# Patient Record
Sex: Male | Born: 2000 | Race: Black or African American | Hispanic: No | Marital: Single
Health system: Southern US, Academic
[De-identification: ages and names within clinical notes are randomized; demographics above are authoritative.]

## PROBLEM LIST (undated history)

## (undated) ENCOUNTER — Encounter: Attending: Family | Primary: Family

## (undated) ENCOUNTER — Ambulatory Visit

## (undated) ENCOUNTER — Ambulatory Visit: Payer: BLUE CROSS/BLUE SHIELD

## (undated) ENCOUNTER — Encounter

## (undated) ENCOUNTER — Ambulatory Visit: Payer: Medicaid (Managed Care) | Attending: Family | Primary: Family

## (undated) ENCOUNTER — Telehealth: Attending: Family | Primary: Family

## (undated) ENCOUNTER — Ambulatory Visit: Payer: Medicaid (Managed Care)

## (undated) ENCOUNTER — Ambulatory Visit: Payer: BLUE CROSS/BLUE SHIELD | Attending: Family | Primary: Family

## (undated) ENCOUNTER — Telehealth

## (undated) ENCOUNTER — Ambulatory Visit: Attending: Family | Primary: Family

## (undated) ENCOUNTER — Encounter
Attending: Pharmacist Clinician (PhC)/ Clinical Pharmacy Specialist | Primary: Pharmacist Clinician (PhC)/ Clinical Pharmacy Specialist

## (undated) HISTORY — PX: ADENOIDECTOMY: SHX5191

## (undated) HISTORY — PX: TYMPANOSTOMY TUBE PLACEMENT: SHX32

## (undated) HISTORY — PX: CIRCUMCISION: SUR203

---
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
## 2019-01-02 ENCOUNTER — Telehealth (INDEPENDENT_AMBULATORY_CARE_PROVIDER_SITE_OTHER): Payer: Self-pay | Admitting: Family

## 2019-01-02 DIAGNOSIS — G43009 Migraine without aura, not intractable, without status migrainosus: Secondary | ICD-10-CM

## 2019-01-02 MED ORDER — TOPIRAMATE 100 MG PO TABS: ORAL_TABLET | ORAL | 5 refills | Status: DC

## 2019-01-03 NOTE — Telephone Encounter (Signed)
Refill was sent to pharmacy.

## 2019-01-24 ENCOUNTER — Other Ambulatory Visit: Payer: Self-pay | Admitting: Family

## 2019-01-24 DIAGNOSIS — G43009 Migraine without aura, not intractable, without status migrainosus: Secondary | ICD-10-CM

## 2019-04-04 ENCOUNTER — Ambulatory Visit (INDEPENDENT_AMBULATORY_CARE_PROVIDER_SITE_OTHER): Payer: Medicaid Other | Admitting: Family

## 2019-04-08 ENCOUNTER — Ambulatory Visit (INDEPENDENT_AMBULATORY_CARE_PROVIDER_SITE_OTHER): Payer: Medicaid Other | Admitting: Family

## 2019-04-08 ENCOUNTER — Encounter (INDEPENDENT_AMBULATORY_CARE_PROVIDER_SITE_OTHER): Payer: Self-pay | Admitting: Family

## 2019-04-08 ENCOUNTER — Other Ambulatory Visit: Payer: Self-pay

## 2019-04-08 VITALS — BP 106/74 | HR 76 | Ht 69.5 in | Wt 178.0 lb

## 2019-04-08 DIAGNOSIS — G43009 Migraine without aura, not intractable, without status migrainosus: Secondary | ICD-10-CM | POA: Diagnosis not present

## 2019-04-08 DIAGNOSIS — G44219 Episodic tension-type headache, not intractable: Secondary | ICD-10-CM | POA: Diagnosis not present

## 2019-04-08 MED ORDER — TOPIRAMATE 100 MG PO TABS: ORAL_TABLET | ORAL | 5 refills | Status: DC

## 2019-04-08 NOTE — Patient Instructions (Signed)
Thank you for coming in today.   Instructions for you until your next appointment are as follows: 1. Continue taking the Topiramate 100mg  at bedtime each day 2. Remember that it is important for you to avoid skipping meals, to drink at leats 48-60 oz of water each day and to get at least 8-9 hours of sleep each night, as these things are known to affect headache frequency and severity 3. Please sign up for MyChart if you have not done so 4. Please plan to return for follow up in one year or sooner if needed.

## 2019-04-08 NOTE — Progress Notes (Signed)
[facility]   MRN:  [id number]  [facility]   Provider: Elveria Risingina Jacqueli Pangallo NP-C [facility]  Visit type: Routine Follow-Up  Last visit: 05/14/2018  Referral source: Steven Moralesavid Theis, MD History from: [facility] chart, mother and patient  Brief history:  History of migraine without aura and episodic tension headaches. He is taking and tolerating Topiramate which has worked well to reduce his headache frequency and severity.    Today's concerns: [place] reports today that he has only experienced a few headaches since his last visit in July. He denies any side effect to Topiramate and says that he drinks a large amount of water each day. He just finished his first year at [employer] [place] in [place] and said that things went well for him. His mother feels that not having a roommate worked well for [place] when he had a headache so that he could get enough rest to recover and not miss classes. He is hopeful that the campus will be open in the fall instead of doing online classwork due to [place] pandemic. He says that he enjoys cooking and eating when he is not in school.   [place] has been otherwise generally healthy since he was last seen. Neither he nor his mother have other health concerns for him today other than previously mentioned.   Review of systems: Please see HPI for neurologic and other pertinent review of systems. Otherwise all other systems were reviewed and were negative.  Problem List: Patient Active Problem List   Diagnosis Date Noted  . Migraine without aura and without status migrainosus, not intractable 01/02/2014  . Episodic tension type headache 01/02/2014     History reviewed. No pertinent past medical history.  Past medical history comments: See HPI Copied from previous record: The patient had ear infections on-off beginning at 3 months until he received myringotomy tubes. He has had streptococcal pharyngitis on 2  occasions beginning at age 89. The patient had onset of allergies at age 89. He was tried on Propranolol for migraine prevention before taking Topiramate but it made him sleepy during the day.   Surgical history: Past Surgical History:  Procedure Laterality Date  . ADENOIDECTOMY    . CIRCUMCISION    . TYMPANOSTOMY TUBE PLACEMENT       Family history: family history includes Migraines in his father.   Social history: Social History   Socioeconomic History  . Marital status: Single    Spouse name: Not on file  . Number of children: Not on file  . Years of education: Not on file  . Highest education level: Not on file  Occupational History  . Not on file  Social Needs  . Financial resource strain: Not on file  . Food insecurity:    Worry: Not on file    Inability: Not on file  . Transportation needs:    Medical: Not on file    Non-medical: Not on file  Tobacco Use  . Smoking status: Never Smoker  . Smokeless tobacco: Never Used  Substance and Sexual Activity  . Alcohol use: No  . Drug use: No  . Sexual activity: Never  Lifestyle  . Physical activity:    Days per week: Not on file    Minutes per session: Not on file  . Stress: Not on file  Relationships  . Social connections:    Talks on phone: Not on file    Gets together: Not on file  Attends religious service: Not on file    Active member of club or organization: Not on file    Attends meetings of clubs or organizations: Not on file    Relationship status: Not on file  . Intimate partner violence:    Fear of current or ex partner: Not on file    Emotionally abused: Not on file    Physically abused: Not on file    Forced sexual activity: Not on file  Other Topics Concern  . Not on file  Social History Narrative   "[place]" will be attending [employer] [place] in the Fall.  He is doing well.   Lives with his mother. He enjoys music, hanging out with his friends, and watching TV.       Past/failed  meds: Propranolol for migraine prevention - had sleepiness as a side effect  Allergies: No Known Allergies    Immunizations:  There is no immunization history on file for this patient.   Physical Exam: BP 106/74   Pulse 76   Ht 5' 9.5" (1.765 m)   Wt 178 lb (80.7 kg)   BMI 25.91 kg/m   General: well developed, well nourished young man, seated on exam table, in no evident distress; black hair, brown eyes, right handed Head: normocephalic and atraumatic. Oropharynx benign. No dysmorphic features. Wears glasses. Neck: supple with no carotid bruits. No focal tenderness. Cardiovascular: regular rate and rhythm, no murmurs. Respiratory: Clear to auscultation bilaterally Abdomen: Bowel sounds present all four quadrants, abdomen soft, non-tender, non-distended.  Musculoskeletal: No skeletal deformities or obvious scoliosis Skin: no rashes or neurocutaneous lesions  Neurologic Exam Mental Status: Awake and fully alert.  Attention span, concentration, and fund of knowledge appropriate for age.  Speech fluent without dysarthria.  Able to follow commands and participate in examination. Cranial Nerves: Fundoscopic exam - red reflex present.  Unable to fully visualize fundus.  Pupils equal briskly reactive to light.  Extraocular movements full without nystagmus.  Visual fields full to confrontation.  Hearing intact and symmetric to finger rub.  Facial sensation intact.  Face, tongue, palate move normally and symmetrically.  Neck flexion and extension normal. Motor: Normal bulk and tone.  Normal strength in all tested extremity muscles. Sensory: Intact to touch and temperature in all extremities. Coordination: Rapid movements: finger and toe tapping normal and symmetric bilaterally.  Finger-to-nose and heel-to-shin intact bilaterally.  Able to balance on either foot. Romberg negative. Gait and Station: Arises from chair, without difficulty. Stance is normal.  Gait demonstrates normal stride length  and balance. Able to hop. Able to heel, toe and tandem walk without difficulty. Reflexes: Diminished and symmetric. Toes downgoing. No clonus.   Impression: 1. Migraine without aura 2. Episodic tension headache   Recommendations for plan of care: The patient's previous [facility] records were reviewed. Mansour has neither had nor required imaging or lab studies since the last visit. He is an [place] year old young man with history of migraine and tension headaches. He is taking and tolerating Topiramate for migraine prevention, which has worked well to reduce headache frequency and severity. I will make no changes in his treatment plan at this time. I reminded Elmon of the need for him to be well hydrated each day, to avoid skipping meals and to get at least 8-9 hours of sleep each night. I will see him back in follow up in 1 year or sooner if needed. He and his mother agreed with the plans made today.  The medication list was reviewed and reconciled. No changes were made in the prescribed medications today. A complete medication list was provided to the patient.  Allergies as of 04/08/2019   No Known Allergies     Medication List       Accurate as of Apr 08, 2019 12:16 PM. If you have any questions, ask your nurse or doctor.        cetirizine 10 MG tablet Commonly known as:  ZYRTEC TK 1 T PO QHS   fluticasone 50 MCG/ACT nasal spray Commonly known as:  FLONASE INSTILL 1 SPRAY INTO EACH NOSTRIL ONCE D   ibuprofen 400 MG tablet Commonly known as:  ADVIL Take 1 tablet (400 mg total) by mouth every 6 (six) hours as needed. Takes as needed   ondansetron 4 MG disintegrating tablet Commonly known as:  Zofran ODT Place 1 tablet under tongue at onset of nausea & vomiting. May repeat in 8 hours if needed.   topiramate 100 MG tablet Commonly known as:  TOPAMAX TAKE 1 TABLET BY MOUTH AT BEDTIME   topiramate 100 MG tablet Commonly known as:  TOPAMAX TAKE 1 TABLET(100 MG) BY MOUTH AT  BEDTIME        Total time spent with the patient was 20 minutes, of which 50% or more was spent in counseling and coordination of care.  Steven Rising NP-C [facility] Ph. [phone] Fax [phone]

## 2019-05-28 ENCOUNTER — Other Ambulatory Visit (INDEPENDENT_AMBULATORY_CARE_PROVIDER_SITE_OTHER): Payer: Self-pay | Admitting: Family

## 2019-05-28 DIAGNOSIS — G43009 Migraine without aura, not intractable, without status migrainosus: Secondary | ICD-10-CM

## 2019-05-28 MED ORDER — IBUPROFEN 400 MG PO TABS: 400.0000 mg | ORAL_TABLET | Freq: Four times a day (QID) | ORAL | 5 refills | Status: AC | PRN

## 2020-01-29 ENCOUNTER — Ambulatory Visit: Payer: Medicaid Other | Attending: Internal Medicine

## 2020-01-29 DIAGNOSIS — Z23 Encounter for immunization: Secondary | ICD-10-CM

## 2020-01-29 NOTE — Progress Notes (Signed)
   Covid-19 Vaccination Clinic  Name:  [facility]    MRN: [id number] DOB: October 04, 2001  01/29/2020  Mr. Christen was observed post Covid-19 immunization for 15 minutes without incident. He was provided with Vaccine Information Sheet and instruction to access the V-Safe system.   Mr. Parkey was instructed to call 911 with any severe reactions post vaccine: Marland Kitchen Difficulty breathing  . Swelling of face and throat  . A fast heartbeat  . A bad rash all over body  . Dizziness and weakness   Immunizations Administered    Name Date Dose VIS Date Route   Pfizer COVID-19 Vaccine 01/29/2020 11:58 AM 0.3 mL 10/24/2019 Intramuscular   Manufacturer: [employer], [employer]   Lot: [id number]   NDC: [address]-3

## 2020-02-03 ENCOUNTER — Other Ambulatory Visit: Payer: Self-pay | Admitting: Pediatrics

## 2020-02-03 DIAGNOSIS — G43009 Migraine without aura, not intractable, without status migrainosus: Secondary | ICD-10-CM

## 2020-02-23 ENCOUNTER — Ambulatory Visit: Payer: Medicaid Other | Attending: Internal Medicine

## 2020-02-23 DIAGNOSIS — Z23 Encounter for immunization: Secondary | ICD-10-CM

## 2020-02-23 NOTE — Progress Notes (Signed)
   Covid-19 Vaccination Clinic  Name:  Steven [place]    MRN: [id number] DOB: 2001-07-19  02/23/2020  [place] was observed post Covid-19 immunization for 15 minutes without incident. He was provided with Vaccine Information Sheet and instruction to access the V-Safe system.   [place] was instructed to call 911 with any severe reactions post vaccine: Marland Kitchen Difficulty breathing  . Swelling of face and throat  . A fast heartbeat  . A bad rash all over body  . Dizziness and weakness   Immunizations Administered    Name Date Dose VIS Date Route   Pfizer COVID-19 Vaccine 02/23/2020 11:01 AM 0.3 mL 10/24/2019 Intramuscular   Manufacturer: [employer], [employer]   Lot: [id number]   NDC: [address]-2

## 2021-01-31 ENCOUNTER — Ambulatory Visit (INDEPENDENT_AMBULATORY_CARE_PROVIDER_SITE_OTHER): Payer: Medicaid Other | Admitting: Family

## 2021-02-21 ENCOUNTER — Other Ambulatory Visit: Payer: Self-pay

## 2021-02-21 ENCOUNTER — Encounter (HOSPITAL_COMMUNITY): Payer: Self-pay

## 2021-02-21 ENCOUNTER — Emergency Department (HOSPITAL_COMMUNITY)
Admission: EM | Admit: 2021-02-21 | Discharge: 2021-02-22 | Disposition: A | Discharge status: Home / Self Care | Payer: Medicaid Other | Attending: Emergency Medicine | Admitting: Emergency Medicine

## 2021-02-21 ENCOUNTER — Emergency Department (HOSPITAL_COMMUNITY): Payer: Medicaid Other

## 2021-02-21 DIAGNOSIS — Z20822 Contact with and (suspected) exposure to covid-19: Secondary | ICD-10-CM | POA: Diagnosis not present

## 2021-02-21 DIAGNOSIS — R748 Abnormal levels of other serum enzymes: Secondary | ICD-10-CM

## 2021-02-21 DIAGNOSIS — R1110 Vomiting, unspecified: Secondary | ICD-10-CM | POA: Diagnosis not present

## 2021-02-21 DIAGNOSIS — R300 Dysuria: Secondary | ICD-10-CM | POA: Insufficient documentation

## 2021-02-21 DIAGNOSIS — R197 Diarrhea, unspecified: Secondary | ICD-10-CM | POA: Diagnosis not present

## 2021-02-21 DIAGNOSIS — R5383 Other fatigue: Secondary | ICD-10-CM | POA: Diagnosis not present

## 2021-02-21 DIAGNOSIS — R509 Fever, unspecified: Secondary | ICD-10-CM | POA: Diagnosis not present

## 2021-02-21 DIAGNOSIS — R519 Headache, unspecified: Secondary | ICD-10-CM | POA: Insufficient documentation

## 2021-02-21 DIAGNOSIS — K760 Fatty (change of) liver, not elsewhere classified: Secondary | ICD-10-CM

## 2021-02-21 DIAGNOSIS — R1084 Generalized abdominal pain: Secondary | ICD-10-CM | POA: Diagnosis present

## 2021-02-21 DIAGNOSIS — R7401 Elevation of levels of liver transaminase levels: Secondary | ICD-10-CM | POA: Insufficient documentation

## 2021-02-21 DIAGNOSIS — R059 Cough, unspecified: Secondary | ICD-10-CM | POA: Insufficient documentation

## 2021-02-21 LAB — URINALYSIS, ROUTINE W REFLEX MICROSCOPIC
Bacteria, UA: NONE SEEN
Bilirubin Urine: NEGATIVE
Glucose, UA: NEGATIVE mg/dL
Hgb urine dipstick: NEGATIVE
Ketones, ur: 80 mg/dL — AB
Leukocytes,Ua: NEGATIVE
Nitrite: NEGATIVE
Protein, ur: 30 mg/dL — AB
Specific Gravity, Urine: 1.027 (ref 1.005–1.030)
pH: 5 (ref 5.0–8.0)

## 2021-02-21 LAB — CBC WITH DIFFERENTIAL/PLATELET
Abs Immature Granulocytes: 0.01 10*3/uL (ref 0.00–0.07)
Basophils Absolute: 0 10*3/uL (ref 0.0–0.1)
Basophils Relative: 0 %
Eosinophils Absolute: 0 10*3/uL (ref 0.0–0.5)
Eosinophils Relative: 0 %
HCT: 42.6 % (ref 39.0–52.0)
Hemoglobin: 13.8 g/dL (ref 13.0–17.0)
Immature Granulocytes: 0 %
Lymphocytes Relative: 16 %
Lymphs Abs: 0.7 10*3/uL (ref 0.7–4.0)
MCH: 29.2 pg (ref 26.0–34.0)
MCHC: 32.4 g/dL (ref 30.0–36.0)
MCV: 90.3 fL (ref 80.0–100.0)
Monocytes Absolute: 0.6 10*3/uL (ref 0.1–1.0)
Monocytes Relative: 14 %
Neutro Abs: 2.8 10*3/uL (ref 1.7–7.7)
Neutrophils Relative %: 70 %
Platelets: 142 10*3/uL — ABNORMAL LOW (ref 150–400)
RBC: 4.72 MIL/uL (ref 4.22–5.81)
RDW: 13.5 % (ref 11.5–15.5)
WBC: 4 10*3/uL (ref 4.0–10.5)
nRBC: 0 % (ref 0.0–0.2)

## 2021-02-21 LAB — COMPREHENSIVE METABOLIC PANEL
ALT: 375 U/L — ABNORMAL HIGH (ref 0–44)
AST: 245 U/L — ABNORMAL HIGH (ref 15–41)
Albumin: 3.8 g/dL (ref 3.5–5.0)
Alkaline Phosphatase: 52 U/L (ref 38–126)
Anion gap: 6 (ref 5–15)
BUN: 7 mg/dL (ref 6–20)
CO2: 26 mmol/L (ref 22–32)
Calcium: 8.7 mg/dL — ABNORMAL LOW (ref 8.9–10.3)
Chloride: 102 mmol/L (ref 98–111)
Creatinine, Ser: 1.1 mg/dL (ref 0.61–1.24)
GFR, Estimated: >60 mL/min (ref >60)
Glucose, Bld: 127 mg/dL — ABNORMAL HIGH (ref 70–99)
Potassium: 3.7 mmol/L (ref 3.5–5.1)
Sodium: 134 mmol/L — ABNORMAL LOW (ref 135–145)
Total Bilirubin: 0.8 mg/dL (ref 0.3–1.2)
Total Protein: 7.1 g/dL (ref 6.5–8.1)

## 2021-02-21 LAB — LIPASE, BLOOD: Lipase: 23 U/L (ref 11–51)

## 2021-02-21 NOTE — ED Triage Notes (Signed)
Pt c/o abdominal pain, back pain, fever, nausea and vomiting that started yesterday.

## 2021-02-21 NOTE — ED Triage Notes (Addendum)
Emergency Medicine Provider Triage Evaluation Note  [place] , a 20 y.o. male  was evaluated in triage.  Pt complains of fever.  Symptoms started on Sunday.  Initially he had developed a headache, sore throat.  Was seen at urgent care.  He had a negative Covid and negative strep test, flu test  Subsequently developed a nonproductive cough.  He still has a headache.  He has light sensitivity.  No recent injury or trauma.  No sudden onset thunderclap headache.  Yesterday evening he had a fever of 101.  Today he woke up with lower back pain he denies any history of IV drug use, bowel or bladder incontinence, saddle paresthesia. Does have some generalized abd pain. He does state that he has some groin pain, no difficulty with urination.  Review of Systems  Positive: Fever, cough, sore throat, headache, back pain Negative: Chest pain, shortness of breath, dysuria, hematuria  Physical Exam  BP 129/81 (BP Location: Right Arm)   Pulse 98   Temp 99.5 F (37.5 C)   Resp 18   Ht 5\' 10"  (1.778 m)   Wt 77.1 kg   SpO2 99%   BMI 24.39 kg/m  Gen:   Awake, no distress   HEENT:  Atraumatic  Resp:  Normal effort  Cardiac:  Normal rate  Abd:   Nondistended, nontender  MSK:   Moves extremities without difficulty. Diffuse tenderness to lower back Neuro:  Speech clear. Ambulatory without difficulty  Medical Decision Making  Medically screening exam initiated at 10:00 PM.  Appropriate orders placed.  [facility] [place] was informed that the remainder of the evaluation will be completed by another provider, this initial triage assessment does not replace that evaluation, and the importance of remaining in the ED until their evaluation is complete.  Clinical Impression  Fever, cough, sore throat, back pain, abd pain       [facility] [place] A, PA-C 02/21/21 2200

## 2021-02-22 ENCOUNTER — Emergency Department (HOSPITAL_COMMUNITY): Payer: Medicaid Other

## 2021-02-22 ENCOUNTER — Emergency Department (HOSPITAL_COMMUNITY)
Admission: EM | Admit: 2021-02-22 | Discharge: 2021-02-23 | Disposition: A | Discharge status: Home / Self Care | Payer: Medicaid Other | Source: Home / Self Care | Attending: Emergency Medicine | Admitting: Emergency Medicine

## 2021-02-22 DIAGNOSIS — R5383 Other fatigue: Secondary | ICD-10-CM | POA: Insufficient documentation

## 2021-02-22 DIAGNOSIS — D696 Thrombocytopenia, unspecified: Secondary | ICD-10-CM | POA: Insufficient documentation

## 2021-02-22 DIAGNOSIS — D72829 Elevated white blood cell count, unspecified: Secondary | ICD-10-CM | POA: Insufficient documentation

## 2021-02-22 DIAGNOSIS — B349 Viral infection, unspecified: Secondary | ICD-10-CM | POA: Insufficient documentation

## 2021-02-22 DIAGNOSIS — Z20822 Contact with and (suspected) exposure to covid-19: Secondary | ICD-10-CM | POA: Insufficient documentation

## 2021-02-22 DIAGNOSIS — R1110 Vomiting, unspecified: Secondary | ICD-10-CM | POA: Insufficient documentation

## 2021-02-22 DIAGNOSIS — R1011 Right upper quadrant pain: Secondary | ICD-10-CM

## 2021-02-22 DIAGNOSIS — R197 Diarrhea, unspecified: Secondary | ICD-10-CM | POA: Insufficient documentation

## 2021-02-22 LAB — HEPATITIS PANEL, ACUTE
HCV Ab: NONREACTIVE
Hep A IgM: NONREACTIVE
Hep B C IgM: NONREACTIVE
Hepatitis B Surface Ag: NONREACTIVE

## 2021-02-22 LAB — COMPREHENSIVE METABOLIC PANEL
ALT: 354 U/L — ABNORMAL HIGH (ref 0–44)
AST: 187 U/L — ABNORMAL HIGH (ref 15–41)
Albumin: 3.8 g/dL (ref 3.5–5.0)
Alkaline Phosphatase: 55 U/L (ref 38–126)
Anion gap: 8 (ref 5–15)
BUN: 10 mg/dL (ref 6–20)
CO2: 28 mmol/L (ref 22–32)
Calcium: 8.7 mg/dL — ABNORMAL LOW (ref 8.9–10.3)
Chloride: 101 mmol/L (ref 98–111)
Creatinine, Ser: 1.04 mg/dL (ref 0.61–1.24)
GFR, Estimated: >60 mL/min (ref >60)
Glucose, Bld: 95 mg/dL (ref 70–99)
Potassium: 3.9 mmol/L (ref 3.5–5.1)
Sodium: 137 mmol/L (ref 135–145)
Total Bilirubin: 0.3 mg/dL (ref 0.3–1.2)
Total Protein: 7.1 g/dL (ref 6.5–8.1)

## 2021-02-22 LAB — CBC
HCT: 40.4 % (ref 39.0–52.0)
Hemoglobin: 13.1 g/dL (ref 13.0–17.0)
MCH: 29 pg (ref 26.0–34.0)
MCHC: 32.4 g/dL (ref 30.0–36.0)
MCV: 89.6 fL (ref 80.0–100.0)
Platelets: 117 10*3/uL — ABNORMAL LOW (ref 150–400)
RBC: 4.51 MIL/uL (ref 4.22–5.81)
RDW: 13.5 % (ref 11.5–15.5)
WBC: 1.9 10*3/uL — ABNORMAL LOW (ref 4.0–10.5)
nRBC: 0 % (ref 0.0–0.2)

## 2021-02-22 LAB — RAPID HIV SCREEN (HIV 1/2 AB+AG)
HIV 1/2 Antibodies: NONREACTIVE
HIV-1 P24 Antigen - HIV24: NONREACTIVE

## 2021-02-22 LAB — LIPASE, BLOOD: Lipase: 21 U/L (ref 11–51)

## 2021-02-22 LAB — MONONUCLEOSIS SCREEN: Mono Screen: NEGATIVE

## 2021-02-22 MED ORDER — SODIUM CHLORIDE 0.9 % IV BOLUS
1000.0000 mL | Freq: Once | INTRAVENOUS | Status: AC
  Administered 2021-02-22: 1000 mL via INTRAVENOUS

## 2021-02-22 MED ORDER — HYDROMORPHONE HCL 1 MG/ML IJ SOLN
1.0000 mg | Freq: Once | INTRAMUSCULAR | Status: AC
  Administered 2021-02-22: 1 mg via INTRAVENOUS
  Filled 2021-02-22: qty 1

## 2021-02-22 MED ORDER — ONDANSETRON HCL 4 MG/2ML IJ SOLN
4.0000 mg | Freq: Once | INTRAMUSCULAR | Status: AC
  Administered 2021-02-22: 4 mg via INTRAVENOUS
  Filled 2021-02-22: qty 2

## 2021-02-22 MED ORDER — ONDANSETRON HCL 4 MG/2ML IJ SOLN
4.0000 mg | Freq: Once | INTRAMUSCULAR | Status: AC
  Administered 2021-02-23: 4 mg via INTRAVENOUS
  Filled 2021-02-22: qty 2

## 2021-02-22 MED ORDER — IOHEXOL 300 MG/ML  SOLN
100.0000 mL | Freq: Once | INTRAMUSCULAR | Status: AC | PRN
  Administered 2021-02-22: 100 mL via INTRAVENOUS

## 2021-02-22 MED ORDER — FENTANYL CITRATE (PF) 100 MCG/2ML IJ SOLN
50.0000 ug | Freq: Once | INTRAMUSCULAR | Status: AC
  Administered 2021-02-23: 50 ug via INTRAVENOUS
  Filled 2021-02-22: qty 2

## 2021-02-22 NOTE — Discharge Instructions (Addendum)
Please contact your primary care doctor and make a follow-up appointment for recheck of your liver enzymes.  If your symptoms change or worsen return to the emergency department.  Your CT scan showed fatty liver disease.  This can commonly cause your liver enzymes to become elevated.  Please follow-up with your doctor regarding these findings.

## 2021-02-22 NOTE — ED Triage Notes (Signed)
Pt c/o RUQ abd pain, n/v/d and fever, seen here last night for same but states he still doesn't feel well. Last dose of motrin around 5pm, highest temp at home 101.7

## 2021-02-22 NOTE — ED Provider Notes (Signed)
MSE was initiated and I personally evaluated the patient and placed orders (if any) at  9:35 PM on February 22, 2021.  The patient appears stable so that the remainder of the MSE may be completed by another provider.  Patient has had fever and right upper quadrant abdominal pain for 4 days.  He was evaluated here in the ED yesterday and noted to have elevated liver enzymes with ALT greater than AST.  CT scan revealed no acute obstruction.  The patient states that he did follow-up with his PCP but is still experiencing his symptoms.  He could not have an ultrasound for 2 weeks, and he states that he is unable to continue on like this.  He is currently comfortable.   Koleen Distance, MD 02/22/21 2136

## 2021-02-22 NOTE — ED Provider Notes (Signed)
[facility] EMERGENCY DEPARTMENT Provider Note   CSN: [id number] Arrival date & time: 02/21/21  2133     History Chief Complaint  Patient presents with  . Abdominal Pain    [place] is a 20 y.o. male.  Patient presents to the emergency department with a chief complaint of abdominal pain.  He states that the symptoms started on Sunday.  He reports having associated fever to 101.7.  He denies nausea, vomiting, or diarrhea.  He does report some left flank pain and dysuria.  He reports associated headache and cough.  Had negative Covid, flu, and strep test at an urgent care.  States that his symptoms have progressively worsened, therefore he came to the emergency department for evaluation.  No successful treatments prior to arrival.  The history is provided by the patient. No language interpreter was used.       History reviewed. No pertinent past medical history.  Patient Active Problem List   Diagnosis Date Noted  . Migraine without aura and without status migrainosus, not intractable 01/02/2014  . Episodic tension type headache 01/02/2014    Past Surgical History:  Procedure Laterality Date  . ADENOIDECTOMY    . CIRCUMCISION    . TYMPANOSTOMY TUBE PLACEMENT         Family History  Problem Relation Age of Onset  . Migraines Father     Social History   Tobacco Use  . Smoking status: Never Smoker  . Smokeless tobacco: Never Used  Substance Use Topics  . Alcohol use: No  . Drug use: No    Home Medications Prior to Admission medications   Medication Sig Start Date End Date Taking? Authorizing Provider  cetirizine (ZYRTEC) 10 MG tablet TK 1 T PO QHS 04/30/17   [provider]  fluticasone (FLONASE) 50 MCG/ACT nasal spray INSTILL 1 SPRAY INTO EACH NOSTRIL ONCE D 04/22/17   [provider]  ibuprofen (ADVIL) 400 MG tablet Take 1 tablet (400 mg total) by mouth every 6 (six) hours as needed. Takes as needed 05/28/19    Elveria Rising, NP  ondansetron (ZOFRAN ODT) 4 MG disintegrating tablet Place 1 tablet under tongue at onset of nausea & vomiting. May repeat in 8 hours if needed. 05/14/18   Elveria Rising, NP  topiramate (TOPAMAX) 100 MG tablet TAKE 1 TABLET(100 MG) BY MOUTH AT BEDTIME 02/03/20   Elveria Rising, NP    Allergies    Patient has no known allergies.  Review of Systems   Review of Systems  All other systems reviewed and are negative.   Physical Exam Updated Vital Signs BP 126/72   Pulse 88   Temp 98.4 F (36.9 C) (Oral)   Resp 17   Ht 5\' 10"  (1.778 m)   Wt 77.1 kg   SpO2 98%   BMI 24.39 kg/m   Physical Exam Vitals and nursing note reviewed.  Constitutional:      Appearance: He is well-developed.  HENT:     Head: Normocephalic and atraumatic.  Eyes:     Conjunctiva/sclera: Conjunctivae normal.  Cardiovascular:     Rate and Rhythm: Normal rate and regular rhythm.     Heart sounds: No murmur heard.   Pulmonary:     Effort: Pulmonary effort is normal. No respiratory distress.     Breath sounds: Normal breath sounds.  Abdominal:     Palpations: Abdomen is soft.     Tenderness: There is no abdominal tenderness.  Musculoskeletal:  General: Normal range of motion.     Cervical back: Neck supple.  Skin:    General: Skin is warm and dry.  Neurological:     Mental Status: He is alert and oriented to person, place, and time.  Psychiatric:        Mood and Affect: Mood normal.        Behavior: Behavior normal.     ED Results / Procedures / Treatments   Labs (all labs ordered are listed, but only abnormal results are displayed) Labs Reviewed  CBC WITH DIFFERENTIAL/PLATELET - Abnormal; Notable for the following components:      Result Value   Platelets 142 (*)    All other components within normal limits  URINALYSIS, ROUTINE W REFLEX MICROSCOPIC - Abnormal; Notable for the following components:   Color, Urine AMBER (*)    APPearance HAZY (*)    Ketones,  ur 80 (*)    Protein, ur 30 (*)    All other components within normal limits  COMPREHENSIVE METABOLIC PANEL - Abnormal; Notable for the following components:   Sodium 134 (*)    Glucose, Bld 127 (*)    Calcium 8.7 (*)    AST 245 (*)    ALT 375 (*)    All other components within normal limits  LIPASE, BLOOD    EKG None  Radiology DG Chest 2 View  Result Date: 02/21/2021 CLINICAL DATA:  Abdominal pain, back pain, fever and nausea. EXAM: CHEST - 2 VIEW COMPARISON:  None. FINDINGS: The heart size and mediastinal contours are within normal limits. Both lungs are clear. The visualized skeletal structures are unremarkable. IMPRESSION: No active cardiopulmonary disease. Electronically Signed   By: Aram Candela M.D.   On: 02/21/2021 22:17   CT ABDOMEN PELVIS W CONTRAST  Result Date: 02/22/2021 CLINICAL DATA:  Abdominal pain and fever. EXAM: CT ABDOMEN AND PELVIS WITH CONTRAST TECHNIQUE: Multidetector CT imaging of the abdomen and pelvis was performed using the standard protocol following bolus administration of intravenous contrast. CONTRAST:  OMNIPAQUE IOHEXOL 300 MG/ML  SOLN COMPARISON:  None. FINDINGS: Lower chest: No acute abnormality. Hepatobiliary: There is diffuse fatty infiltration of the liver parenchyma. No focal liver abnormality is seen. No gallstones, gallbladder wall thickening, or biliary dilatation. Pancreas: Unremarkable. No pancreatic ductal dilatation or surrounding inflammatory changes. Spleen: Normal in size without focal abnormality. Adrenals/Urinary Tract: Adrenal glands are unremarkable. Kidneys are normal, without renal calculi or hydronephrosis. Subcentimeter well-defined areas of low attenuation are seen within both kidneys. These areas are too small to characterize on CT examination. Bladder is unremarkable. Stomach/Bowel: Stomach is within normal limits. Appendix appears normal. No evidence of bowel wall thickening, distention, or inflammatory changes.  Vascular/Lymphatic: No significant vascular findings are present. No enlarged abdominal or pelvic lymph nodes. Reproductive: Prostate is unremarkable. Other: No abdominal wall hernia or abnormality. No abdominopelvic ascites. Musculoskeletal: No acute or significant osseous findings. IMPRESSION: Hepatic steatosis. Electronically Signed   By: Aram Candela M.D.   On: 02/22/2021 02:46    Procedures Procedures   Medications Ordered in ED Medications  HYDROmorphone (DILAUDID) injection 1 mg (has no administration in time range)  ondansetron (ZOFRAN) injection 4 mg (has no administration in time range)  sodium chloride 0.9 % bolus 1,000 mL (has no administration in time range)    ED Course  I have reviewed the triage vital signs and the nursing notes.  Pertinent labs & imaging results that were available during my care of the patient were reviewed by me  and considered in my medical decision making (see chart for details).    MDM Rules/Calculators/A&P                          This patient complains of generalized abdominal pain, this involves an extensive number of treatment options, and is a complaint that carries with it a high risk of complications and morbidity.    Differential Dx Pancreatitis, cholecystitis, kidney stone, UTI  Pertinent Labs I ordered, reviewed, and interpreted labs, which included CBC which was normal, CMP notable for moderately hyponatremia at 134, mildly elevated transaminases, AST 235, ALT 375, normal T bili.  Imaging Interpretation I ordered imaging studies which included CT abdomen/pelvis shows hepatic steatosis.   Reassessments After the interventions stated above, I reevaluated the patient and found with stable vitals.  I reviewed his results.  I suspect that his elevated transaminases are from the fatty liver.  He denies heavy alcohol use.  States that he took 2 Tylenol, but did not use any more than that.  I am doubt Tylenol overdose or other drug  toxicity.  He is well-appearing, and in no acute distress.   Plan Discharge with outpatient follow-up with PCP for elevated LFTs.  I ordered a hepatitis panel to assist with outpatient work-up.  Patient discussed with Dr. Pilar Plate, who also suggests adding mono to the differential, but this is thought less likely based on CBC differential.  Dr. Pilar Plate agrees, and is agreeable with discharge plan.    Final Clinical Impression(s) / ED Diagnoses Final diagnoses:  Hepatic steatosis  Elevated liver enzymes    Rx / DC Orders ED Discharge Orders    None       Roxy Horseman, PA-C 02/22/21 1610    Sabas Sous, MD 02/22/21 0700

## 2021-02-23 ENCOUNTER — Emergency Department (HOSPITAL_COMMUNITY): Payer: Medicaid Other

## 2021-02-23 LAB — URINALYSIS, ROUTINE W REFLEX MICROSCOPIC
Bacteria, UA: NONE SEEN
Bilirubin Urine: NEGATIVE
Glucose, UA: NEGATIVE mg/dL
Hgb urine dipstick: NEGATIVE
Ketones, ur: 80 mg/dL — AB
Leukocytes,Ua: NEGATIVE
Nitrite: NEGATIVE
Protein, ur: 30 mg/dL — AB
Specific Gravity, Urine: 1.02 (ref 1.005–1.030)
pH: 5 (ref 5.0–8.0)

## 2021-02-23 LAB — DIFFERENTIAL
Abs Immature Granulocytes: 0 10*3/uL (ref 0.00–0.07)
Basophils Absolute: 0 10*3/uL (ref 0.0–0.1)
Basophils Relative: 1 %
Eosinophils Absolute: 0 10*3/uL (ref 0.0–0.5)
Eosinophils Relative: 0 %
Immature Granulocytes: 0 %
Lymphocytes Relative: 40 %
Lymphs Abs: 0.8 10*3/uL (ref 0.7–4.0)
Monocytes Absolute: 0.3 10*3/uL (ref 0.1–1.0)
Monocytes Relative: 12 %
Neutro Abs: 1 10*3/uL — ABNORMAL LOW (ref 1.7–7.7)
Neutrophils Relative %: 47 %

## 2021-02-23 LAB — RAPID URINE DRUG SCREEN, HOSP PERFORMED
Amphetamines: NOT DETECTED
Barbiturates: NOT DETECTED
Benzodiazepines: NOT DETECTED
Cocaine: NOT DETECTED
Opiates: NOT DETECTED
Tetrahydrocannabinol: NOT DETECTED

## 2021-02-23 LAB — HEPATITIS PANEL, ACUTE
HCV Ab: NONREACTIVE
Hep A IgM: NONREACTIVE
Hep B C IgM: NONREACTIVE
Hepatitis B Surface Ag: NONREACTIVE

## 2021-02-23 LAB — CK: Total CK: 201 U/L (ref 49–397)

## 2021-02-23 LAB — SARS CORONAVIRUS 2 BY RT PCR (HOSPITAL ORDER, PERFORMED IN ~~LOC~~ HOSPITAL LAB): SARS Coronavirus 2: NEGATIVE

## 2021-02-23 MED ORDER — SODIUM CHLORIDE 0.9 % IV BOLUS (SEPSIS)
2000.0000 mL | Freq: Once | INTRAVENOUS | Status: AC
  Administered 2021-02-23: 2000 mL via INTRAVENOUS

## 2021-02-23 MED ORDER — IBUPROFEN 200 MG PO TABS
400.0000 mg | ORAL_TABLET | Freq: Once | ORAL | Status: AC
  Administered 2021-02-23: 400 mg via ORAL
  Filled 2021-02-23: qty 2

## 2021-02-23 MED ORDER — FENTANYL CITRATE (PF) 100 MCG/2ML IJ SOLN
50.0000 ug | Freq: Once | INTRAMUSCULAR | Status: AC
Start: 2021-02-23 — End: 2021-02-23
  Administered 2021-02-23: 50 ug via INTRAVENOUS
  Filled 2021-02-23: qty 2

## 2021-02-23 NOTE — ED Notes (Signed)
Ultrasound tech at pt bedside  

## 2021-02-23 NOTE — ED Provider Notes (Signed)
[facility]-EMERGENCY DEPT Provider Note   CSN: [id number] Arrival date & time: 02/22/21  2012     History Chief Complaint  Patient presents with  . Abdominal Pain    Steven Walter is a 20 y.o. male.  The history is provided by the patient and a parent.  Abdominal Pain Pain location:  RUQ Pain quality: not aching   Pain radiates to:  Does not radiate Pain severity:  Moderate Onset quality:  Gradual Duration:  4 days Timing:  Intermittent Progression:  Worsening Chronicity:  New Context: not alcohol use, not recent travel and not suspicious food intake   Relieved by:  Nothing Worsened by:  Movement Associated symptoms: cough, diarrhea, fatigue, fever, sore throat and vomiting   Associated symptoms: no dysuria   Patient history of migraines presents with continued right upper quad abdominal pain and fevers.  He also reports sore throat. Patient reports approximately 4 days ago he began having fevers and sore throat.  His mother has been giving him Tylenol.  He had abdominal pain and vomiting was seen in the emergency department had a CT scan which showed liver enlargement.  After being discharged from the ER he began vomiting again.  He was seen by his PCP, and was scheduled for an outpatient ultrasound.  He reports the pain and vomiting continue.  He also reports continued continued fever over 100.  He also reports continued sore throat  No recent travel.  No IV drug abuse.  Denies alcohol abuse.  He does not abuse Tylenol and has not overdosed.  No recent camping or tick bites.       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  Review of Systems   Review of Systems  Constitutional: Positive for fatigue and fever.  HENT: Positive for sore throat.   Respiratory: Positive for cough.   Gastrointestinal: Positive  for abdominal pain, diarrhea and vomiting.  Genitourinary: Negative for dysuria.  Musculoskeletal: Positive for myalgias. Negative for joint swelling.  All other systems reviewed and are negative.   Physical Exam Updated Vital Signs BP 125/80 (BP Location: Left Arm)   Pulse 92   Temp 98.9 F (37.2 C) (Oral)   Resp 17   Ht 1.778 m (5\' 10" )   Wt 77.1 kg   SpO2 96%   BMI 24.39 kg/m   Physical Exam  CONSTITUTIONAL: Well developed/well nourished HEAD: Normocephalic/atraumatic EYES: EOMI/PERRL, no icterus ENMT: Mucous membranes moist, uvula midline mild erythema, no large exudates.  No stridor or drooling NECK: supple no meningeal signs SPINE/BACK:entire spine nontender CV: S1/S2 noted, no murmurs/rubs/gallops noted LUNGS: Lungs are clear to auscultation bilaterally, no apparent distress ABDOMEN: soft, mild RUQ tenderness, no hepatomegaly, no rebound or guarding, bowel sounds noted throughout abdomen GU:no cva tenderness NEURO: Pt is awake/alert/appropriate, moves all extremitiesx4.  No facial droop.   EXTREMITIES: pulses normal/equal, full ROM, no joint swelling SKIN: warm, color normal, no rash PSYCH: no abnormalities of mood noted, alert and oriented to situation  ED Results / Procedures / Treatments   Labs (all labs ordered are listed, but only abnormal results are displayed) Labs Reviewed  COMPREHENSIVE METABOLIC PANEL - Abnormal; Notable for the following components:      Result Value   Calcium 8.7 (*)    AST 187 (*)    ALT 354 (*)  All other components within normal limits  CBC - Abnormal; Notable for the following components:   WBC 1.9 (*)    Platelets 117 (*)    All other components within normal limits  URINALYSIS, ROUTINE W REFLEX MICROSCOPIC - Abnormal; Notable for the following components:   Ketones, ur 80 (*)    Protein, ur 30 (*)    All other components within normal limits  DIFFERENTIAL - Abnormal; Notable for the following components:   Neutro Abs  1.0 (*)    All other components within normal limits  SARS CORONAVIRUS 2 (HOSPITAL ORDER, PERFORMED IN [facility])  LIPASE, BLOOD  HEPATITIS PANEL, ACUTE  RAPID HIV SCREEN (HIV 1/2 AB+AG)  MONONUCLEOSIS SCREEN  CK  RAPID URINE DRUG SCREEN, HOSP PERFORMED  POC SARS CORONAVIRUS 2 AG -  ED    EKG None  Radiology DG Chest 2 View  Result Date: 02/22/2021 CLINICAL DATA:  Right upper quadrant abdominal pain, nausea, vomiting, diarrhea, fever EXAM: CHEST - 2 VIEW COMPARISON:  02/21/2021 FINDINGS: The heart size and mediastinal contours are within normal limits. Both lungs are clear. The visualized skeletal structures are unremarkable. IMPRESSION: No active cardiopulmonary disease. Electronically Signed   By: Sharlet Salina M.D.   On: 02/22/2021 21:52    Procedures Procedures   Medications Ordered in ED Medications  fentaNYL (SUBLIMAZE) injection 50 mcg (50 mcg Intravenous Given 02/23/21 0034)  ondansetron (ZOFRAN) injection 4 mg (4 mg Intravenous Given 02/23/21 0031)  sodium chloride 0.9 % bolus 2,000 mL (2,000 mLs Intravenous New Bag/Given 02/23/21 0254)  ibuprofen (ADVIL) tablet 400 mg (400 mg Oral Given 02/23/21 0255)  fentaNYL (SUBLIMAZE) injection 50 mcg (50 mcg Intravenous Given 02/23/21 0255)    ED Course  I have reviewed the triage vital signs and the nursing notes.  Pertinent labs & imaging results that were available during my care of the patient were reviewed by me and considered in my medical decision making (see chart for details).    MDM Rules/Calculators/A&P                          2:46 AM Patient presents for repeat visit for continued vomiting, fever right upper quadrant abdominal pain.  He was recently found to have abnormal liver function testing and hepatic steatosis on CT imaging.  He has been seen as an outpatient as well and has an ultrasound pending  Labs indicate continued liver dysfunction but no significant alterations from recent. 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  He reports continued sore throat and fevers which indicate a likely viral illness.  HIV and hepatitis screening are negative Will obtain right upper quadrant ultrasound and reassess. 4:30 AM Right upper quadrant ultrasound is negative. Patient feels improved. He is in no acute distress.  He is not septic appearing. Still strongly considering mononucleosis Plan will be discharged home, he will stay home from college, and rest.  Discussed need to avoid all contact sports. He will see his PCP early next week.  He will need to have a repeat mono test as well as CBC to ensure his white blood cell count and platelets are improving these are likely transient depression We discussed return precautions Final Clinical Impression(s) / ED Diagnoses Final diagnoses:  RUQ pain  Viral illness  Thrombocytopenia (HCC)  Leukocytosis, unspecified type  Rx / DC Orders ED Discharge Orders    None       Zadie Rhine, MD 02/23/21 [phone]

## 2021-02-23 NOTE — Discharge Instructions (Signed)
PLEASE HAVE FOLLOWUP ON Monday BE SURE TO HAVE A REPEAT MONO TEST AND ALSO CHECK YOUR WHITE BLOOD CELL COUNT AND PLATELETS AS THEY WERE LOW ON THIS VISIT PLEASE AVOID ALL CONTACT SPORTS

## 2021-03-17 ENCOUNTER — Encounter (INDEPENDENT_AMBULATORY_CARE_PROVIDER_SITE_OTHER): Payer: Self-pay

## 2021-03-17 DIAGNOSIS — B20 Human immunodeficiency virus [HIV] disease: Principal | ICD-10-CM

## 2021-03-23 ENCOUNTER — Ambulatory Visit: Admit: 2021-03-23 | Discharge: 2021-03-23 | Payer: BLUE CROSS/BLUE SHIELD | Attending: Clinical | Primary: Clinical

## 2021-03-23 ENCOUNTER — Ambulatory Visit: Admit: 2021-03-23 | Discharge: 2021-03-23 | Payer: BLUE CROSS/BLUE SHIELD | Attending: Family | Primary: Family

## 2021-03-23 DIAGNOSIS — B20 Human immunodeficiency virus [HIV] disease: Principal | ICD-10-CM

## 2021-03-23 MED ORDER — BICTEGRAVIR 50 MG-EMTRICITABINE 200 MG-TENOFOVIR ALAFENAM 25 MG TABLET
ORAL_TABLET | Freq: Every day | ORAL | 0 refills | 30.00000 days | Status: CP
Start: 2021-03-23 — End: ?

## 2021-03-24 MED ORDER — BICTEGRAVIR 50 MG-EMTRICITABINE 200 MG-TENOFOVIR ALAFENAM 25 MG TABLET
ORAL_TABLET | Freq: Every day | ORAL | 5 refills | 30 days | Status: CP
Start: 2021-03-24 — End: ?
  Filled 2021-03-23: qty 30, 30d supply, fill #0

## 2021-03-28 DIAGNOSIS — B20 Human immunodeficiency virus [HIV] disease: Principal | ICD-10-CM

## 2021-04-25 ENCOUNTER — Ambulatory Visit: Admit: 2021-04-25 | Discharge: 2021-04-26 | Payer: BLUE CROSS/BLUE SHIELD | Attending: Family | Primary: Family

## 2021-04-25 DIAGNOSIS — B20 Human immunodeficiency virus [HIV] disease: Principal | ICD-10-CM

## 2021-06-27 ENCOUNTER — Ambulatory Visit: Admit: 2021-06-27 | Discharge: 2021-06-28 | Payer: BLUE CROSS/BLUE SHIELD | Attending: Family | Primary: Family

## 2021-06-27 DIAGNOSIS — R52 Pain, unspecified: Principal | ICD-10-CM

## 2021-06-27 DIAGNOSIS — B20 Human immunodeficiency virus [HIV] disease: Principal | ICD-10-CM

## 2021-06-27 DIAGNOSIS — Z0000 Encounter for general adult medical examination without abnormal findings: Principal | ICD-10-CM

## 2021-06-27 MED ORDER — BICTEGRAVIR 50 MG-EMTRICITABINE 200 MG-TENOFOVIR ALAFENAM 25 MG TABLET
ORAL_TABLET | Freq: Every day | ORAL | 1 refills | 90 days | Status: CP
Start: 2021-06-27 — End: ?

## 2021-07-22 ENCOUNTER — Institutional Professional Consult (permissible substitution): Admit: 2021-07-22 | Discharge: 2021-07-23 | Payer: BLUE CROSS/BLUE SHIELD

## 2021-07-22 DIAGNOSIS — Z0000 Encounter for general adult medical examination without abnormal findings: Principal | ICD-10-CM

## 2021-07-22 DIAGNOSIS — B20 Human immunodeficiency virus [HIV] disease: Principal | ICD-10-CM

## 2021-07-29 ENCOUNTER — Ambulatory Visit: Admit: 2021-07-29 | Payer: BLUE CROSS/BLUE SHIELD

## 2021-10-31 ENCOUNTER — Ambulatory Visit: Admit: 2021-10-31 | Discharge: 2021-11-01 | Payer: BLUE CROSS/BLUE SHIELD | Attending: Family | Primary: Family

## 2021-10-31 DIAGNOSIS — Z113 Encounter for screening for infections with a predominantly sexual mode of transmission: Principal | ICD-10-CM

## 2021-10-31 DIAGNOSIS — R5383 Other fatigue: Principal | ICD-10-CM

## 2021-10-31 DIAGNOSIS — B20 Human immunodeficiency virus [HIV] disease: Principal | ICD-10-CM

## 2021-10-31 DIAGNOSIS — R0609 Other forms of dyspnea: Principal | ICD-10-CM

## 2022-03-01 ENCOUNTER — Ambulatory Visit: Admit: 2022-03-01 | Discharge: 2022-03-02 | Payer: BLUE CROSS/BLUE SHIELD | Attending: Family | Primary: Family

## 2022-03-01 DIAGNOSIS — B20 Human immunodeficiency virus [HIV] disease: Principal | ICD-10-CM

## 2022-03-01 DIAGNOSIS — Z0000 Encounter for general adult medical examination without abnormal findings: Principal | ICD-10-CM

## 2022-03-01 DIAGNOSIS — Z202 Contact with and (suspected) exposure to infections with a predominantly sexual mode of transmission: Principal | ICD-10-CM

## 2022-03-01 MED ORDER — BICTEGRAVIR 50 MG-EMTRICITABINE 200 MG-TENOFOVIR ALAFENAM 25 MG TABLET
ORAL_TABLET | Freq: Every day | ORAL | 1 refills | 90 days | Status: CP
Start: 2022-03-01 — End: ?

## 2022-03-01 MED ORDER — DOXYCYCLINE MONOHYDRATE 100 MG CAPSULE
ORAL_CAPSULE | Freq: Two times a day (BID) | ORAL | 0 refills | 7 days | Status: CP
Start: 2022-03-01 — End: 2022-03-08
  Filled 2022-03-01: qty 14, 7d supply, fill #0

## 2022-04-12 MED ORDER — CABOTEGRAVIR ER 600 MG/3 ML-RILPIVIRINE ER 900 MG/3ML IM SUSPENSION,ER
INTRAMUSCULAR | 0 refills | 60.00000 days | Status: CP
Start: 2022-04-12 — End: 2022-05-13
  Filled 2022-04-19: qty 6, 30d supply, fill #0

## 2022-04-12 NOTE — Unmapped (Signed)
[facility] Specialty Medication Onboarding    Specialty Medication: CABENUVA 600 mg-900 mg/3 mL extended-release injection (cabotegravir-rilpivirine)  Prior Authorization: Not Required   Financial Assistance: No - copay  <$25  Final Copay/Day Supply: $4 / 30    Insurance Restrictions: Yes - max 1 month supply     Notes to Pharmacist:     The triage team has completed the benefits investigation and has determined that the patient is able to fill this medication at [facility]. Please contact the patient to complete the onboarding or follow up with the prescribing physician as needed.

## 2022-04-12 NOTE — Unmapped (Signed)
CABENUVA REFERRAL    Cabenuva referral received. Patient has been screened by provider and is appropriate for therapy.  Prescription sent to: [facility]    Meilah Delrosario, PharmD, BCIDP, CPP  Clinical Pharmacist Practitioner - HIV/Infectious Diseases    INFECTIOUS DISEASES CLINIC  [address]  [place], [place]  [address]  P [phone]  F [phone]

## 2022-04-14 NOTE — Unmapped (Signed)
This patient is receiving Cabenuva as a clinic administered medication. Pharmacist reviewed the prescription and the patient's chart and determined that therapy is appropriate.  Patient is: aware of copay $4.00 . All [facility] and the [facility]. This patient is being disenrolled from our specialty management program and will be added to a patient list for appropriate follow up.      Corliss SkainsGreg M. AlseaRochelle, VermontPharm.D.  Specialty Pharmacist  [facility]  [phone] option 4 then option 2

## 2022-04-18 DIAGNOSIS — B20 Human immunodeficiency virus [HIV] disease: Principal | ICD-10-CM

## 2022-04-18 NOTE — Unmapped (Signed)
Cabenuva Approval Note    Based on the information provided from [facility], the patient is approved to start Cabenuva at this time.    Insurance Info: [place] Medicaid Healthy Blue  Type of Billing: Pharmacy  Dosing Schedule: Bi-monthly  Pharmacy: [facility]    I have reached out for scheduling of the first injection.    Danton Clap, PharmD, BCIDP, CPP  Clinical Pharmacist Practitioner - HIV/Infectious Diseases    INFECTIOUS DISEASES CLINIC  [address]  [place], [place]  [address]  P [phone]  F [phone]

## 2022-04-25 NOTE — Unmapped (Addendum)
Cabenuva start rescheduled for 6/16.    ----- Message from Cassie Freer sent at 04/25/2022 10:54 AM EDT -----  Regarding: Robert Moss  Incoming Call  Caller: zack Jolinda Croak  Best callback number: [phone]  Reason for call: cabenuva injection    The patient wanted to see if he could reschedule to Friday instead Thursday

## 2022-04-28 ENCOUNTER — Institutional Professional Consult (permissible substitution): Admit: 2022-04-28 | Discharge: 2022-04-29 | Payer: BLUE CROSS/BLUE SHIELD

## 2022-04-28 DIAGNOSIS — B20 Human immunodeficiency virus [HIV] disease: Principal | ICD-10-CM

## 2022-04-28 LAB — COMPREHENSIVE METABOLIC PANEL
ALBUMIN: 4.1 g/dL (ref 3.4–5.0)
ALKALINE PHOSPHATASE: 40 U/L — ABNORMAL LOW (ref 46–116)
ALT (SGPT): 9 U/L — ABNORMAL LOW (ref 10–49)
ANION GAP: 7 mmol/L (ref 5–14)
AST (SGOT): 14 U/L (ref <=34)
BILIRUBIN TOTAL: 0.9 mg/dL (ref 0.3–1.2)
BLOOD UREA NITROGEN: 9 mg/dL (ref 9–23)
BUN / CREAT RATIO: 9
CALCIUM: 9.2 mg/dL (ref 8.7–10.4)
CHLORIDE: 106 mmol/L (ref 98–107)
CO2: 26 mmol/L (ref 20.0–31.0)
CREATININE: 0.97 mg/dL
EGFR CKD-EPI (2021) MALE: >90 mL/min/{1.73_m2} (ref >=60)
GLUCOSE RANDOM: 87 mg/dL (ref 70–179)
POTASSIUM: 3.6 mmol/L (ref 3.4–4.8)
PROTEIN TOTAL: 6.7 g/dL (ref 5.7–8.2)
SODIUM: 139 mmol/L (ref 135–145)

## 2022-04-28 MED ADMIN — cabotegravir-rilpivirine (CABENUVA) 600 mg-900 mg/3 mL extended-release injection 6 mL: 6 mL | INTRAMUSCULAR | @ 20:00:00 | Stop: 2022-04-28

## 2022-04-28 NOTE — Unmapped (Signed)
Patient here for first Cabenuva injections. Reviewed patient education sheet. Stated understanding of +/- 7 day rule for scheduling future appointments. PHQ-9 completed. PHQ-9 score: 0. Labs obtained. Tolerated well. Next appointment made for 05/26/2022 .

## 2022-05-08 NOTE — Unmapped (Signed)
Linkage & Retention Coordinator sent an Artera text message and MyChart message to [name] patient in efforts to retain care, provide information about ways to stay in care, and give tips for a self-care summer.       Bridge Counseling Care Plan: Out of Care Patient (>6 months)    Goal: Patient will have an office visit within 6 months of the last office visit unless stated otherwise by their provider.     Contact Interventions may include:  ??? Placing phone calls to the patient and/or listed contacts.  ??? Sending the patient an Out of Care text message through the Artera App or Bridge Counseling cell phone (if texting agreement is signed).  ??? Sending the patient an Out of Care MyChart/letter.  ?? Contacting the patient's most recent pharmacies as needed for additional information such as contact information or refill history.  ?? Contacting the patient's case manager/caregiver as needed for additional information, if assigned.  ?? Researching other online resources as needed.     If the patient is reached, the Bridge Counselor will attempt to:  ?? Update all contact information including primary and secondary phone numbers and emergency contact details and address. Confirm access to MyChart.  ?? Complete the Out of Care Intervention questionnaire with the patient.  ?? Explore and discuss any barriers to visit attendance the patient may be experiencing that result in missed appointments, no-shows, cancellations, or lack of a scheduled follow-up appointment. These may include communication barriers (language, phone access, texting, MyChart), transportation, financial concerns, time off work, mental health/substance use, and/or multiple medical appointments.  ?? Make appropriate referrals and link the patient to clinic nurses, social workers, benefits counselors, or other support services in the [facility] ID clinic to address concerns or barriers to care.  ?? Schedule a follow-up appointment at an appropriate interval based on a completed assessment.  ?? Confirm the patient has access to ART medications until that appointment. If the patient is not currently taking ART, needs refills, or is having difficulty taking the medication, refer to the clinical staff for follow up via InBasket messaging.    ?? Monitor attendance of the patient's scheduled appointment and provide reminder calls/texts/messages consistent with patient preference, if needed.  ?? Continue follow-up if indicated.    Expected Outcome:  Patient will have an office visit within 6 months of the last office visit unless stated otherwise by their provider.    If the patient has an urgent medical problem send a chat message to the clinic nursing staff for immediate follow-up     **If the patient is not able to be located or retained in HIV care, a referral will be placed to the [facility] Bridge Counselor for further follow-up.    Duration of Service: 10 minutes    Robert Moss  Linkage and Retention Coordinator   Infectious Diseases Clinic

## 2022-05-10 NOTE — Unmapped (Signed)
Medical Case Management Social Work Note     Duration of Intervention: 60 minutes    TYPE OF CONTACT: Text      MINI COMPREHENSIVE ASSESSMENT    ** MEDICAL NEEDS **  1. HIV Medical Needs: No  2. HIV Medication Adherence: Pt did not specify at this time.   3. HIV Medical Care (engagement in care): Yes, pt stated that he was moving for school on August 17th  and would like to continue care with [facility] but needed to know his options. SW sent Dr. Lyda Perone and nurse Rayburn Ma a message to reach out to pt. SW inquired about pts options for meds due to pt receiving second Cabenuva shot on 7/14. SW will update pt on findings at a later date.  4. Non-HIV Medical Needs: No    ** MH/SA NEEDS **  5. Mental Health: Pt reported being in a good space. Pt stated that he is excited about moving to [place] for school. Pt reported that he is going to [employer]. Pt reported that he is still attending therapy and states that he loves it.  6. Drug/Alcohol Use: Pt did not specify at this time.    ** FISCAL NEEDS **  7. Financial Situation: Pt did not report any financial concerns at this time.   8. Benefits/Insurance/Pharmacy Assistance: Pt did not specify at this time.    ** BASIC NEEDS **  9. Housing: Pt stated that he found out that he was approved for his apartment in [place] on 05/09/2022.  10. Transportation: Pt did not specify at this time.   11. Social Support System: Pt did not specify at this time.   12. Food/Nutrition, Clothing, Utilities: Pt did not specify at this time.     ** PREVENTION NEEDS **  13. Risk Reduction Counseling Needs: Pt did not specify at this time.   14. Disclosure to Partners: Pt did not specify at this time.     ** LEGAL NEEDS **  15. Pt did not specify at this time.     ** RESOURCE COORDINATION **  16. Navigating social/health care systems: Pt did not specify at this time.   17. Care coordination: Pt did not specify at this time.     **ACTIVITIES OF DAILY LIVING  18. ADL assistance: Pt did not specify at this time.       Enrolled in Medical Case Management:  yes       INTERVENTION:  SW provided active listening, validation, empathic feedback, unconditional positive regard, and congruence within the relationship.    Medical Case Management Care Plan    ID CARE PLAN - HEALTH MAINTENANCE    Need: Continued supportive health maintenance     Short Term Goal/Objective:   1. Continue to support patient's medication adherence   2. Continue to support patient's medical appointment adherence   3. Develop plan to address barriers as they arise by contacting social worker  4. Continue to engage with financial counselors to assist in ensuring eligibility of Juanell Fairly and [employer].     Person Responsible for Action: patient, Child psychotherapist, provider, financial counselors    Intervention/Activities/Actions:   1. Provide appointment reminders one week and one day prior to his appointment.   2. Provide follow up information regarding patient's lab results one week post appointment.   3. Provide support regarding medication adherence, risk reduction, or other counseling as needed.   4. Reinforcement of information as needed during clinic visits or by phone through  calls and text messages.     Outcome:  1. Patient asks questions/seeks information to reinforce or expand understanding of HIV.   2. Document evidence in EPIC that patient has either attended next scheduled ID Clinic appointment or has called to reschedule  3. Patient takes prescribed medication on a daily basis as reported to SW or patient's medical provider.    This patient is being provided a service through [employer] of resort. A benefits investigation was done by social work staff and the patient was found to have no insurance or that their health insurance does not cover the services necessary for patient success. In some cases, if health insurance does cover the service, social work staff serve as interim providers until a long term service provider can be found in cases where current service providers are not accessible due to affordability, availability, or specialist knowledge.     Shayma Pfefferle, MSW  [facility] Social Work

## 2022-05-11 NOTE — Unmapped (Signed)
[facility] Administered Medication Refill Coordination Note      NAME:Babyboy Deegan Valentino DOB: 03-Jul-2001      Medication: [name]    Day Supply: 56 days      SHIPPING      Next delivery from [facility] [phone]) to  [facility]  for Kendarrius Dquan Cortopassi is scheduled for 07/12.    Clinic contact: BJ Turner    Patient's next nurse visit for administration: 07/14.    We will follow up with clinic monthly for standard refill processing and delivery.      Merrill Deanda Samella Parr  Specialty Pharmacy Technician

## 2022-05-19 NOTE — Unmapped (Signed)
Medical Case Management Social Work Note     Duration of Intervention: 20 minutes    TYPE OF CONTACT: Text    ASSESSMENT: Clinical staff added patient a provider appointment to discuss options for care moving forward.    ADHERENCE: Did not discuss at this interaction due to competing priorities and/or no concerns.    INTERVENTION:  SW reached out to patient to inform him of findings.     PLAN:  Patient stated that he would be in attendance and inform SW of any barriers to care.     Robert Moss, MSW  [facility] Social Work

## 2022-05-23 MED FILL — CABENUVA 600 MG/3 ML-900 MG/3 ML IM SUSPENSION, EXTENDED RELEASE: INTRAMUSCULAR | 30 days supply | Qty: 6 | Fill #1

## 2022-05-26 ENCOUNTER — Ambulatory Visit: Admit: 2022-05-26 | Discharge: 2022-05-27 | Payer: BLUE CROSS/BLUE SHIELD | Attending: Family | Primary: Family

## 2022-05-26 ENCOUNTER — Institutional Professional Consult (permissible substitution): Admit: 2022-05-26 | Discharge: 2022-05-27 | Payer: BLUE CROSS/BLUE SHIELD

## 2022-05-26 DIAGNOSIS — B20 Human immunodeficiency virus [HIV] disease: Principal | ICD-10-CM

## 2022-05-26 DIAGNOSIS — Z113 Encounter for screening for infections with a predominantly sexual mode of transmission: Principal | ICD-10-CM

## 2022-05-26 LAB — CBC W/ AUTO DIFF
BASOPHILS ABSOLUTE COUNT: 0 10*9/L (ref 0.0–0.1)
BASOPHILS RELATIVE PERCENT: 0.4 %
EOSINOPHILS ABSOLUTE COUNT: 0 10*9/L (ref 0.0–0.5)
EOSINOPHILS RELATIVE PERCENT: 0.9 %
HEMATOCRIT: 43.7 % (ref 39.0–48.0)
HEMOGLOBIN: 14.2 g/dL (ref 12.9–16.5)
LYMPHOCYTES ABSOLUTE COUNT: 1.7 10*9/L (ref 1.1–3.6)
LYMPHOCYTES RELATIVE PERCENT: 29.2 %
MEAN CORPUSCULAR HEMOGLOBIN CONC: 32.6 g/dL (ref 32.0–36.0)
MEAN CORPUSCULAR HEMOGLOBIN: 29.4 pg (ref 25.9–32.4)
MEAN CORPUSCULAR VOLUME: 90.4 fL (ref 77.6–95.7)
MEAN PLATELET VOLUME: 8.1 fL (ref 6.8–10.7)
MONOCYTES ABSOLUTE COUNT: 0.4 10*9/L (ref 0.3–0.8)
MONOCYTES RELATIVE PERCENT: 7.2 %
NEUTROPHILS ABSOLUTE COUNT: 3.6 10*9/L (ref 1.8–7.8)
NEUTROPHILS RELATIVE PERCENT: 62.3 %
PLATELET COUNT: 167 10*9/L (ref 150–450)
RED BLOOD CELL COUNT: 4.84 10*12/L (ref 4.26–5.60)
RED CELL DISTRIBUTION WIDTH: 13.6 % (ref 12.2–15.2)
WBC ADJUSTED: 5.7 10*9/L (ref 3.6–11.2)

## 2022-05-26 LAB — BASIC METABOLIC PANEL
ANION GAP: 8 mmol/L (ref 5–14)
BLOOD UREA NITROGEN: 8 mg/dL — ABNORMAL LOW (ref 9–23)
BUN / CREAT RATIO: 11
CALCIUM: 9.4 mg/dL (ref 8.7–10.4)
CHLORIDE: 105 mmol/L (ref 98–107)
CO2: 25.6 mmol/L (ref 20.0–31.0)
CREATININE: 0.76 mg/dL
EGFR CKD-EPI (2021) MALE: >90 mL/min/{1.73_m2} (ref >=60)
GLUCOSE RANDOM: 86 mg/dL (ref 70–179)
POTASSIUM: 3.8 mmol/L (ref 3.4–4.8)
SODIUM: 139 mmol/L (ref 135–145)

## 2022-05-26 LAB — ALT: ALT (SGPT): 8 U/L — ABNORMAL LOW (ref 10–49)

## 2022-05-26 LAB — BILIRUBIN, TOTAL: BILIRUBIN TOTAL: 0.6 mg/dL (ref 0.3–1.2)

## 2022-05-26 LAB — AST: AST (SGOT): 12 U/L (ref <=34)

## 2022-05-26 MED ADMIN — cabotegravir-rilpivirine (CABENUVA) 600 mg-900 mg/3 mL extended-release injection 6 mL: 6 mL | INTRAMUSCULAR | @ 20:00:00 | Stop: 2022-05-26

## 2022-05-26 NOTE — Unmapped (Signed)
Referral Services Note     Duration of Intervention: 15 minutes    TYPE OF CONTACT: Face to Face - In Person    ASSESSMENT: Patient was seen in clinic for a follow up appointment. Patient is getting ready to enroll at [facility] and has questions regarding his medical coverage.    INTERVENTION:  Sw spoke with the patient in detail about his concerns and needs for medical care. The patient wants to keep his medical care with [facility] and plans to travel back to [facility] for medical appointments. The patient currently has Medicaid and needs to obtain additional information regarding coverage and OOS insurance options. Sw encouraged the patient to contact his Medicaid representative for further information.     PLAN:  Patient plans to make contact with the [facility] office.     Sofie Rower, LCSW-A  [facility] Social Work

## 2022-05-26 NOTE — Unmapped (Signed)
Patient here for Cabenuva injections. Denies changes in mood or sleep. Tolerated well. Next appointment made for 07/21/2022.
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
## 2022-05-27 LAB — HIV RNA, QUANTITATIVE, PCR: HIV RNA QNT RSLT: NOT DETECTED

## 2022-05-27 LAB — LYMPH MARKER LIMITED,FLOW
ABSOLUTE CD3 CNT: 1445 {cells}/uL (ref 915–3400)
ABSOLUTE CD4 CNT: 612 {cells}/uL (ref 510–2320)
ABSOLUTE CD8 CNT: 799 {cells}/uL (ref 180–1520)
CD3% (T CELLS): 85 % (ref 61–86)
CD4% (T HELPER): 36 % (ref 34–58)
CD4:CD8 RATIO: 0.8 — ABNORMAL LOW (ref 0.9–4.8)
CD8% T SUPPRESR: 47 % — ABNORMAL HIGH (ref 12–38)

## 2022-05-27 NOTE — Unmapped (Signed)
COVID Education:  Make sure you perform good hand washing (lasting 20 seconds), continue to social distance and limit close personal contact (which may include new sexual partners or having multiple partners during this period).  Try to isolate at home but please find ways to keep in touch with those close to you, such as meeting up with them electronically or socially distanced, and the ability to go outdoors alone or separated from others  If you become ill with fever, respiratory illness, sudden loss of taste and smell, stomach issues, diarrhea, nausea, vomiting - contact clinic for further instructions.  You should go to the emergency department if you develop systems such as shortness of breath, confusion, lightheadedness when standing, high fever.   Here is some information about HIV and CoVid vaccines: [email or web address]  If you're interested in receiving the CoVid vaccine when you're eligible, here are some resources for you to check and make an appointment:  Your local health department   [email or web address] through [facility]  [email or web address]  [facility] (if you are an established patient with them)  [email or web address]    URGENT CARE  Please call ahead to speak with the nursing staff if you are in need of an urgent appointment.       MEDICATIONS  For refills please contact your pharmacy and ask them to electronically send or fax the request to the clinic.   Please bring all medications in original bottles to every appointment.    HMAP (formerly ADAP) or [employer] Eligibility (required even if you do not receive medication through [facility])  Please remember to renew your Juanell Fairly eligibility during renewal periods which occur twice a year: January-March and July-September.     The following are needed for each renewal:   - [facility] Identification (if you don't have one, then a bill with your name and address in [place])   - proof of income (award letter, W-2, or last three check stubs)   If you are unable to come in for renewal, let us know if we can mail, fax or e-mail paperwork to you.   HMAP Contact: [phone].     Lab info:  Your most recent CD4 T-cell counts and viral loads are below. Here are a few things to keep in mind when looking at your numbers:  Our goal is to get your virus to be undetectable and keep it undetectable. If the virus is undetectable you are much more likely to stay healthy.  We consider your viral load to be undetectable if it says <40 or if it says Not detected.  For most people, we're checking CD4 counts every other visit (once or twice a year, or sometimes even less).  It's normal for your CD4 count to be different from visit to visit.   You can help by taking your medications at about the same time, every single day. If you're having trouble with taking your medications, it's important to let us know.    Lab Results   Component Value Date    ACD4 588 03/01/2022    CD4 42 03/01/2022    HIVCP <40 (H) 04/25/2021    HIVRS Not Detected 03/01/2022        Please note that your laboratory and other results may be visible to you in real time, possibly before they reach your provider. Please allow 48 hours for clinical interpretation of these results. Importantly, even if a result is flagged as abnormal, it may not be one that impacts  your health.    It was nice to have a visit with you today!  Follow-up information:        Provider today:  Varney Daily, FNP-BC      ID CLINIC address:   [facility] at [facility]  [address]  [place], [place] [address]    Contact information:    The ID clinic phone number is [phone]   The ID clinic fax number is [phone]  For urgent issues on nights and weekends: Call the ID Physician on-call through the [facility] Operator at [phone].    Please sign up for My [facility] Chart - This is a great way to review your labs and track your appointments    Please try to arrive 30 minutes BEFORE your scheduled appointment time!  This will give you time to fill out any front desk paperwork needed for your visit, and allow you to be seen as close to your scheduled appointment time as possible.

## 2022-05-29 LAB — SYPHILIS SCREEN: SYPHILIS RPR SCREEN: NONREACTIVE

## 2022-05-29 LAB — HEPATITIS B SURFACE ANTIBODY
HEPATITIS B SURFACE ANTIBODY QUANT: >1000 m[IU]/mL — ABNORMAL HIGH (ref <8.00)
HEPATITIS B SURFACE ANTIBODY: REACTIVE — AB

## 2022-05-29 NOTE — Unmapped (Signed)
Contacted patient via mobile and confirmed two identifiers.  Patient already aware of +GC in throat and rectum and got treated yesterday with injection. Reminded patient to make sure partner treated and need for test of cure. He agrees and will follow up accordingly.

## 2022-05-31 NOTE — Unmapped (Signed)
Medical Case Management Social Work Note     Duration of Intervention: 10 minutes    TYPE OF CONTACT: Text    ASSESSMENT: Patient was due for a SW check in.    ADHERENCE: Did not discuss at this interaction due to competing priorities and/or no concerns.    INTERVENTION:  SW provided active listening, validation, empathic feedback, unconditional positive regard, and congruence within the relationship.      PLAN:  Patients text reported no presenting concerns at this time. SW will reach out to patient at a later date.      Mikyla Schachter, MSW  [place] ID Youth Social Work

## 2022-06-14 NOTE — Unmapped (Signed)
[facility] Administered Medication Refill Coordination Note      NAME:Robert Moss DOB: 2001-03-22      Medication: [name]    Day Supply: 56 days      SHIPPING      Next delivery from [facility] [phone]) to  [facility]  for Robert Moss is scheduled for 08/10.    Clinic contact: Rayburn Ma    Patient's next nurse visit for administration: 09/08.    We will follow up with clinic monthly for standard refill processing and delivery.      Doyne Micke Samella Parr  Specialty Pharmacy Technician

## 2022-06-21 MED FILL — CABENUVA 600 MG/3 ML-900 MG/3 ML IM SUSPENSION, EXTENDED RELEASE: INTRAMUSCULAR | 34 days supply | Qty: 6 | Fill #0

## 2022-07-07 NOTE — Unmapped (Signed)
Benefits Counselor contacted patient to schedule appointment for [facility] [employer] renewal.     Outcome: Made telephone appointment       Phone set for 07/10/22 and Benefits Counselor informed patient on the proper documents to bring to upcoming appointment.    Duration: 5 min    Lauren Gardiner Barefoot

## 2022-07-10 ENCOUNTER — Ambulatory Visit: Admit: 2022-07-10 | Discharge: 2022-07-11 | Payer: BLUE CROSS/BLUE SHIELD

## 2022-07-10 NOTE — Unmapped (Signed)
Patient completed RW paperwork. Patient is eligible for RW B&C grant services and [employer] on Charges. IPL=0%;FPL= 0%. Expires: 12/28/22- previously done    Rhetta Mura  ID Clinic Benefits Counselor  Time of Intervention-21mins

## 2022-07-10 NOTE — Unmapped (Signed)
Name: Baylin Suba  Date: 07/10/2022  Address: [address]  [place] [address]   [place] of Residence:  [place]  Phone: [phone]     Started assessment with patient options: over the phone     Is this the same address for mailing? Yes  If No, Mailing Address is:     Librarian, academic    Tax Filing Status  I did not file taxes     Employment Status  Unemployed    Income  No Household Income/Deductions of any kind    If no or low income, how are you meeting your basic needs?  Family Support    List Tax Household Members including relationship to you:   n/a    Someone in my household receives: No Household Income/Deductions of any kind  Specify who: N/a    Do you have a current diagnosis for Hepatitis C?  No results found for: HEPCAB, HCVR, HCVRNA, HCVIU, HCVRNAIU, HCVGENOTYPE    Have you used tobacco products four or more times per week in the last six months?  No    Teacher, adult education  Patient has affordable insurance through [employer], [place], and or Employment and is not eligible.    Patient given ACA education if they qualified based on answers to questions above.     MyChart  Do you have an active MyChart account? Yes     If MyChart is not set up, informed patient on how to set up MyChart N/A    Patient was informed of the following programs;   N/A    The following applications/handouts were given to patient:   N/A    The following forms were also started with the patient:   N/A    Juanell Fairly application status: Complete    Patient is applying for [employer] on Charges Only     Additional Comments: No issues accessing meds. No longer working.      Rhetta Mura  ID Clinic Benefits Counselor  Time of Intervention-59mins

## 2022-07-16 DIAGNOSIS — B20 Human immunodeficiency virus [HIV] disease: Principal | ICD-10-CM

## 2022-07-21 ENCOUNTER — Institutional Professional Consult (permissible substitution): Admit: 2022-07-21 | Discharge: 2022-07-22 | Payer: BLUE CROSS/BLUE SHIELD

## 2022-07-21 ENCOUNTER — Ambulatory Visit: Admit: 2022-07-21 | Discharge: 2022-07-22 | Payer: BLUE CROSS/BLUE SHIELD | Attending: Family | Primary: Family

## 2022-07-21 DIAGNOSIS — B20 Human immunodeficiency virus [HIV] disease: Principal | ICD-10-CM

## 2022-07-21 MED ADMIN — cabotegravir-rilpivirine (CABENUVA) 600 mg-900 mg/3 mL extended-release injection 6 mL: 6 mL | INTRAMUSCULAR | @ 18:00:00 | Stop: 2022-07-21

## 2022-07-21 NOTE — Unmapped (Signed)
Patient here for Cabenuva injections. Denies changes in mood or sleep. Tolerated well. Next appointment made for 11/3.

## 2022-08-03 IMAGING — CT CT ABD-PELV W/ CM
2 of 4 series · 16 of 46 positions shown, 18 images · IV contrast (APPLIED)
Comparison: None.

CLINICAL DATA: Abdominal pain and fever.

EXAM:
CT ABDOMEN AND PELVIS WITH CONTRAST
TECHNIQUE: Multidetector CT imaging of the abdomen and pelvis was performed
using the standard protocol following bolus administration of
intravenous contrast.
CONTRAST:  100mL OMNIPAQUE IOHEXOL 300 MG/ML  SOLN

[Series 3: abdomen 5.0 · axial · 0.66mm/px · z∈[-582,-192]mm · 13 of 90 slices shown, 15 images]
[im 6/90  soft-tissue]
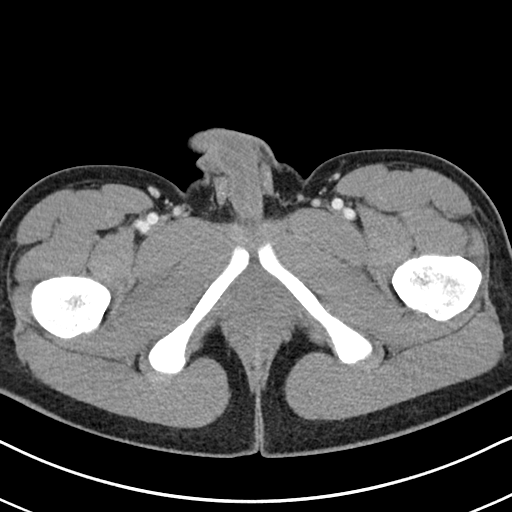
[im 6/90  bone]
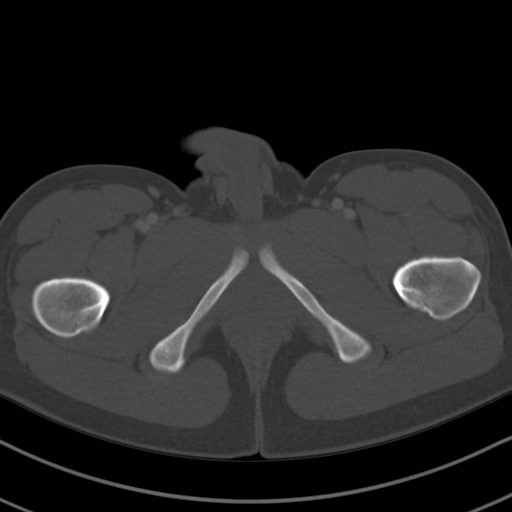
[im 11/90  soft-tissue]
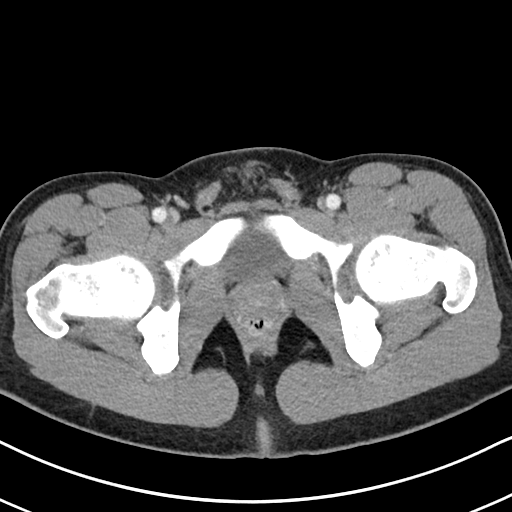
[im 21/90  soft-tissue]
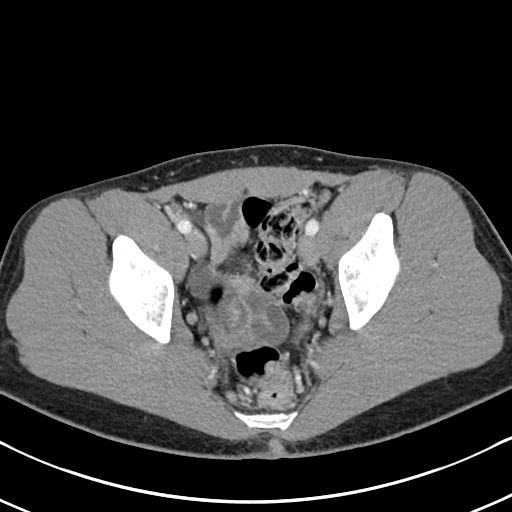
[im 27/90  soft-tissue]
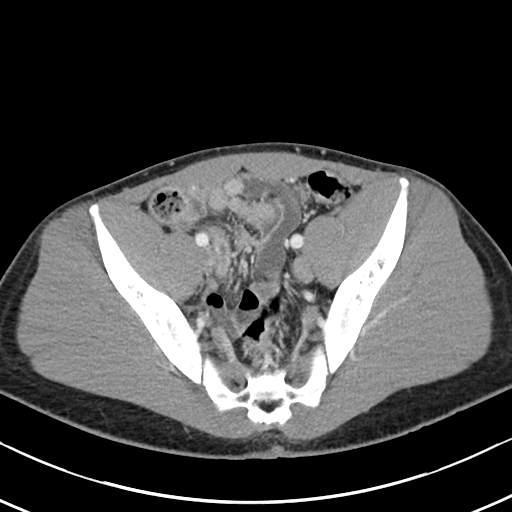
[im 32/90  soft-tissue]
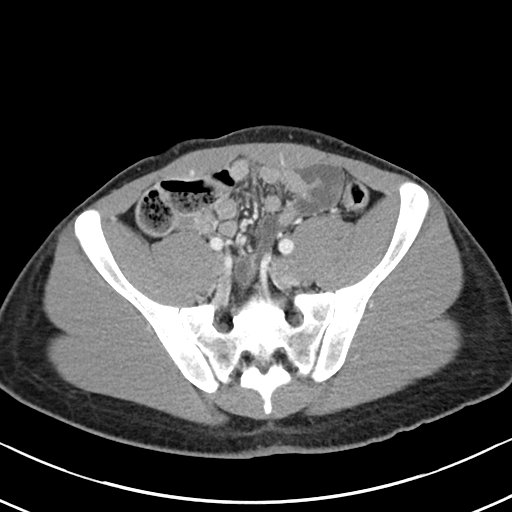
[im 37/90  soft-tissue]
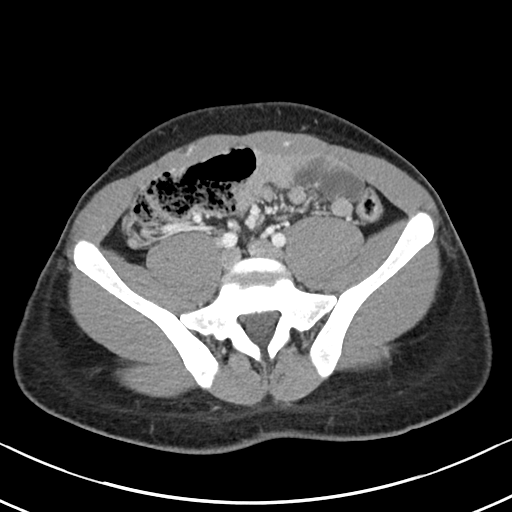
[im 48/90  soft-tissue]
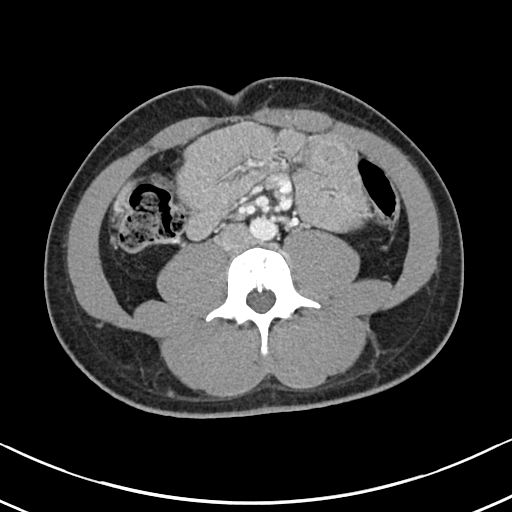
[im 53/90  soft-tissue]
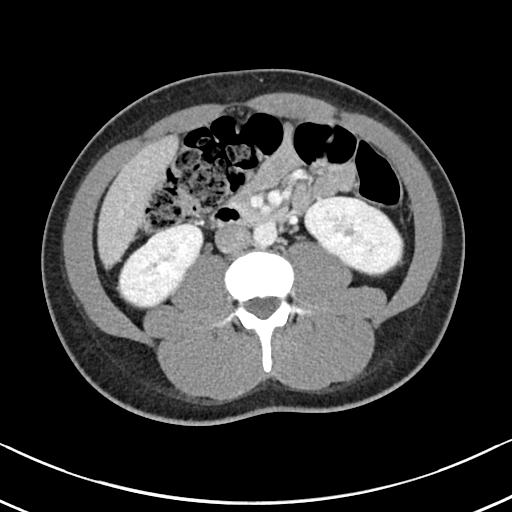
[im 58/90  soft-tissue]
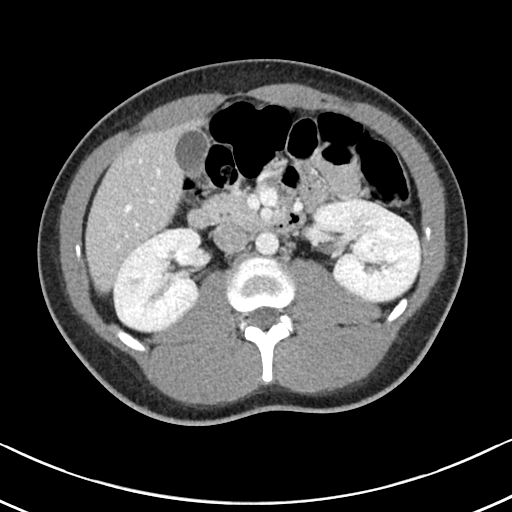
[im 58/90  bone]
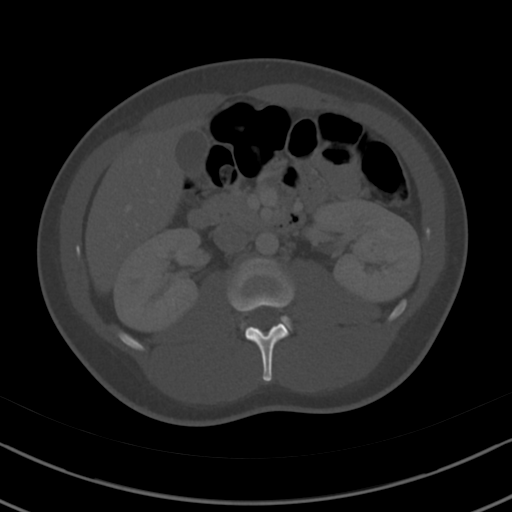
[im 63/90  soft-tissue]
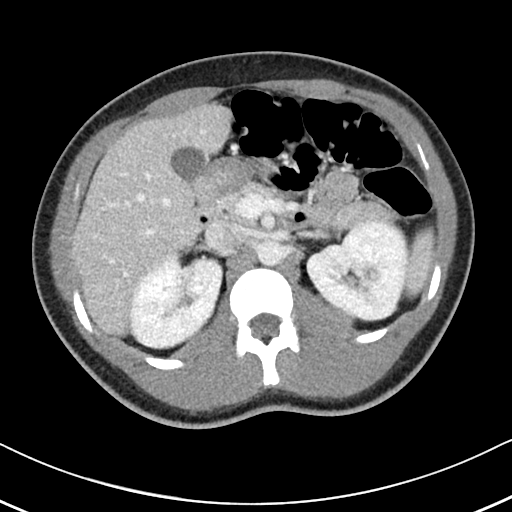
[im 69/90  soft-tissue]
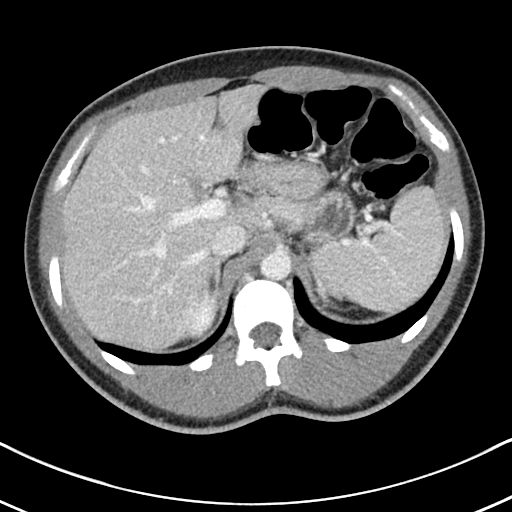
[im 79/90  soft-tissue]
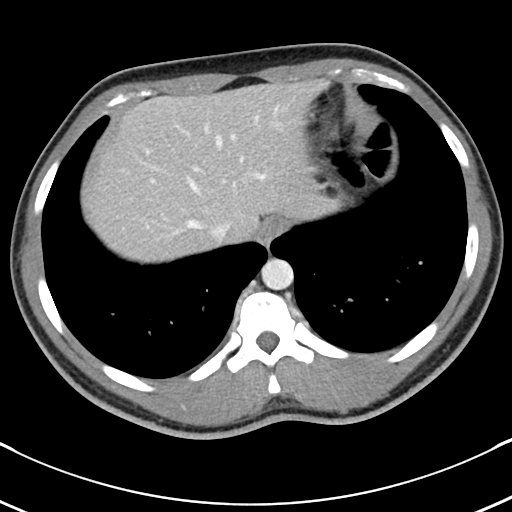
[im 84/90  soft-tissue]
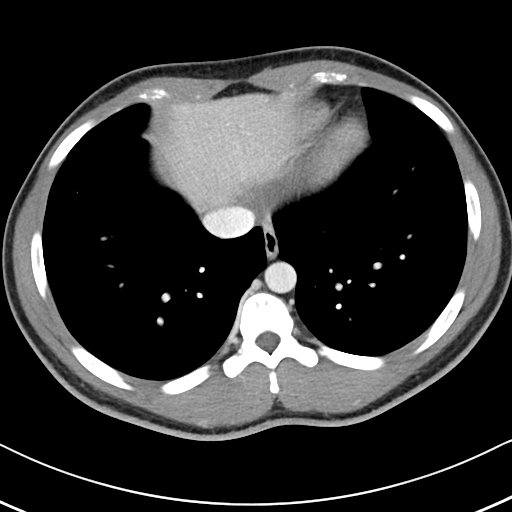

[Series 6: abdomen 3.0 mpr cor · coronal · 0.69mm/px · 3 of 93 slices shown]
[im 31/93  soft-tissue]
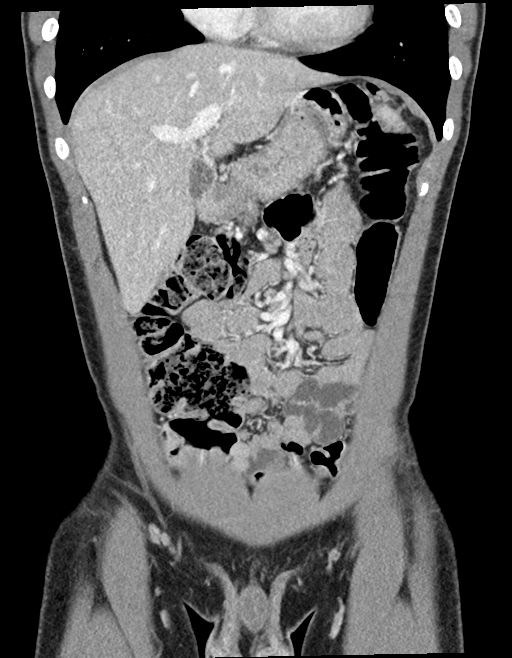
[im 41/93  soft-tissue]
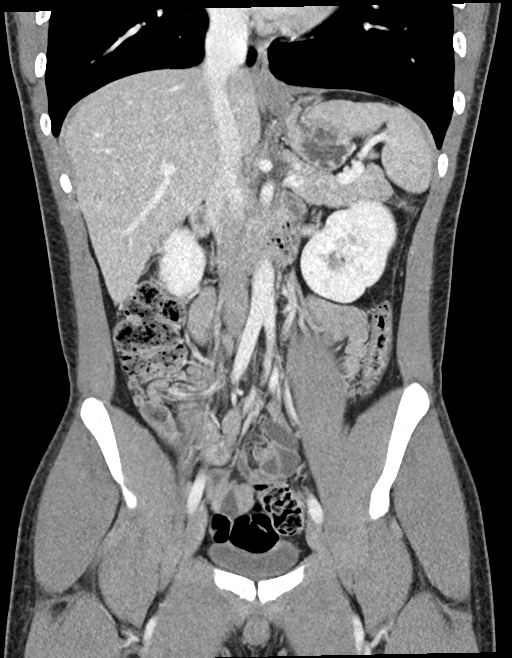
[im 52/93  soft-tissue]
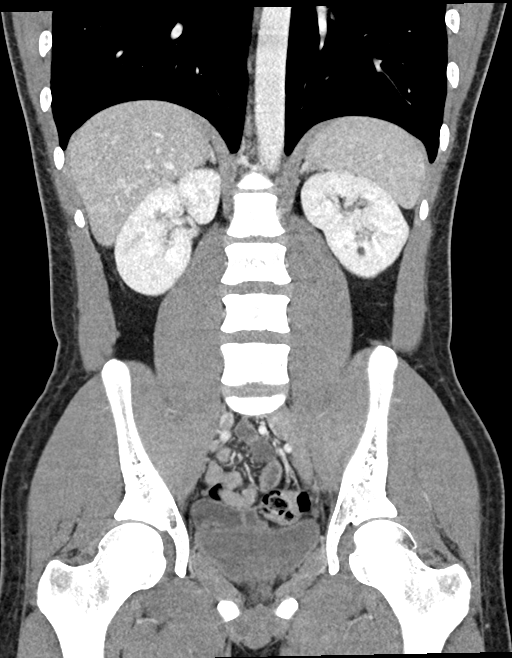

[16 of 46 positions shown; findings below may reference images not displayed]

FINDINGS: Lower chest: No acute abnormality.

Hepatobiliary: There is diffuse fatty infiltration of the liver
parenchyma. No focal liver abnormality is seen. No gallstones,
gallbladder wall thickening, or biliary dilatation.

Pancreas: Unremarkable. No pancreatic ductal dilatation or
surrounding inflammatory changes.

Spleen: Normal in size without focal abnormality.

Adrenals/Urinary Tract: Adrenal glands are unremarkable. Kidneys are
normal, without renal calculi or hydronephrosis. Subcentimeter
well-defined areas of low attenuation are seen within both kidneys.
These areas are too small to characterize on CT examination. Bladder
is unremarkable.

Stomach/Bowel: Stomach is within normal limits. Appendix appears
normal. No evidence of bowel wall thickening, distention, or
inflammatory changes.

Vascular/Lymphatic: No significant vascular findings are present. No
enlarged abdominal or pelvic lymph nodes.

Reproductive: Prostate is unremarkable.

Other: No abdominal wall hernia or abnormality. No abdominopelvic
ascites.

Musculoskeletal: No acute or significant osseous findings.
IMPRESSION: Hepatic steatosis.

## 2022-08-10 NOTE — Unmapped (Signed)
Medical Case Management Social Work Note     Duration of Intervention: 30 minutes    TYPE OF CONTACT: Text    ASSESSMENT: Patient was due for a SW check in. Patient stated that he is doing well. Patient stated that he has moved for school and he likes it. Patient stated,  Life is good just classes and field placement. Patient stated that he is getting his MSW. Patients texts indicated no presenting concerns at this time.    ADHERENCE: Did not discuss at this interaction due to competing priorities and/or no concerns.    INTERVENTION:  SW provided active listening, validation, empathic feedback, unconditional positive regard, and congruence within the relationship.    PLAN:  SW will continue [facility] with patient.       Christl Fessenden, MSW   ID Youth Social Work

## 2022-08-11 NOTE — Unmapped (Signed)
[facility] Clinic Administered Medication Refill Coordination Note      NAME:Jaterrius Trask Wnek DOB: [birth year]      Medication: [name]    Day Supply: 56 days      SHIPPING      Next delivery from [facility] [phone]) to  [facility]  for Jesiah Dmarion Vivier is scheduled for 10/12.    Clinic contact: BJ Turner    Patient's next nurse visit for administration: 11/03.    We will follow up with clinic monthly for standard refill processing and delivery.      Asianna Brundage Samella Parr  Specialty Pharmacy Technician

## 2022-08-20 DIAGNOSIS — B20 Human immunodeficiency virus [HIV] disease: Principal | ICD-10-CM

## 2022-08-23 MED FILL — CABENUVA 600 MG/3 ML-900 MG/3 ML IM SUSPENSION, EXTENDED RELEASE: INTRAMUSCULAR | 34 days supply | Qty: 6 | Fill #1

## 2022-09-15 ENCOUNTER — Institutional Professional Consult (permissible substitution): Admit: 2022-09-15 | Discharge: 2022-09-16 | Payer: BLUE CROSS/BLUE SHIELD

## 2022-09-15 DIAGNOSIS — B20 Human immunodeficiency virus [HIV] disease: Principal | ICD-10-CM

## 2022-09-15 MED ADMIN — cabotegravir-rilpivirine (CABENUVA) 600 mg-900 mg/3 mL extended-release injection 6 mL: 6 mL | INTRAMUSCULAR | @ 18:00:00 | Stop: 2022-09-15

## 2022-09-15 NOTE — Unmapped (Signed)
Medical Case Management Social Work Note     Duration of Intervention: 20 minutes    TYPE OF CONTACT: Face to Face - In Person      MINI COMPREHENSIVE ASSESSMENT    ** MEDICAL NEEDS **  1. HIV Medical Needs: Patient did not specify at this time.   2. HIV Medication Adherence: Patient did not specify at this time.   3. HIV Medical Care (engagement in care): Patient did not specify at this time.   4. Non-HIV Medical Needs: Patient did not specify at this time.     ** MH/SA NEEDS **  5. Mental Health: No, patient stated that he is in a good mental space. Patient stated that he has started going back to therapy.  6. Drug/Alcohol Use: Patient did not specify at this time.     ** FISCAL NEEDS **  7. Financial Situation: Patient did not specify at this time.   8. Benefits/Insurance/Pharmacy Assistance: Patient did not specify at this time.     ** BASIC NEEDS **  9. Housing: Stable/Permanent, Patient is currently in school. Patient stated that he enjoys school.  10. Transportation: Patient did not specify at this time.   11. Social Support System: No, Patient was accompanied by his mom at his visit.   12. Food/Nutrition, Clothing, Utilities: Patient did not specify at this time.     ** PREVENTION NEEDS **  13. Risk Reduction Counseling Needs: Patient did not specify at this time.   14. Disclosure to Partners: Patient did not specify at this time.     ** LEGAL NEEDS **  15. Patient did not specify at this time.     ** RESOURCE COORDINATION **  16. Navigating social/health care systems: Patient did not specify at this time.   17. Care coordination: Patient did not specify at this time.     **ACTIVITIES OF DAILY LIVING  18. ADL assistance: Patient did not specify at this time.       Enrolled in Medical Case Management:  yes       INTERVENTION:  SW provided active listening, validation, empathic feedback, unconditional positive regard, and congruence within the relationship.      Medical Case Management Care Plan    ID CARE PLAN - HEALTH MAINTENANCE    Need: Continued supportive health maintenance     Short Term Goal/Objective:   1. Continue to support patient's medication adherence   2. Continue to support patient's medical appointment adherence   3. Develop plan to address barriers as they arise by contacting social worker  4. Continue to engage with financial counselors to assist in ensuring eligibility of Juanell Fairly and [employer].     Person Responsible for Action: patient, Child psychotherapist, provider, financial counselors    Intervention/Activities/Actions:   1. Provide appointment reminders one week and one day prior to his appointment.   2. Provide follow up information regarding patient's lab results one week post appointment.   3. Provide support regarding medication adherence, risk reduction, or other counseling as needed.   4. Reinforcement of information as needed during clinic visits or by phone through calls and text messages.     Outcome:  1. Patient asks questions/seeks information to reinforce or expand understanding of HIV.   2. Document evidence in EPIC that patient has either attended next scheduled ID Clinic appointment or has called to reschedule  3. Patient takes prescribed medication on a daily basis as reported to SW or patient's medical provider.  This patient is being provided a service through [employer] of resort. A benefits investigation was done by social work staff and the patient was found to have no insurance or that their health insurance does not cover the services necessary for patient success. In some cases, if health insurance does cover the service, social work staff serve as interim providers until a long term service provider can be found in cases where current service providers are not accessible due to affordability, availability, or specialist knowledge.         Hamlet Lasecki, MSW  [facility]

## 2022-09-15 NOTE — Unmapped (Signed)
Patient here for Cabenuva injections. Denies changes in mood or sleep. Tolerated well. Next appointment made for 11/10/22.

## 2022-10-15 DIAGNOSIS — B20 Human immunodeficiency virus [HIV] disease: Principal | ICD-10-CM

## 2022-10-19 NOTE — Unmapped (Signed)
[facility] Administered Medication Refill Coordination Note      NAME:Robert Moss DOB: Oct 22, 2001      Medication: [name]    Day Supply: 56 days      SHIPPING      Next delivery from [facility] [phone]) to  ID CLinic  for Robert Moss is scheduled for 12/20.    Clinic contact: BJ Turner    Patient's next nurse visit for administration: 12/29.    We will follow up with clinic monthly for standard refill processing and delivery.      Karlyn Glasco Samella Parr  Specialty Pharmacy Technician

## 2022-10-31 MED FILL — CABENUVA 600 MG/3 ML-900 MG/3 ML IM SUSPENSION, EXTENDED RELEASE: INTRAMUSCULAR | 34 days supply | Qty: 6 | Fill #2

## 2022-11-10 ENCOUNTER — Ambulatory Visit: Admit: 2022-11-10 | Discharge: 2022-11-11 | Payer: BLUE CROSS/BLUE SHIELD | Attending: Family | Primary: Family

## 2022-11-10 ENCOUNTER — Institutional Professional Consult (permissible substitution): Admit: 2022-11-10 | Discharge: 2022-11-11 | Payer: BLUE CROSS/BLUE SHIELD

## 2022-11-10 DIAGNOSIS — Z23 Encounter for immunization: Principal | ICD-10-CM

## 2022-11-10 DIAGNOSIS — Z113 Encounter for screening for infections with a predominantly sexual mode of transmission: Principal | ICD-10-CM

## 2022-11-10 DIAGNOSIS — B20 Human immunodeficiency virus [HIV] disease: Principal | ICD-10-CM

## 2022-11-10 MED ADMIN — cabotegravir-rilpivirine (CABENUVA) 600 mg-900 mg/3 mL extended-release injection 6 mL: 6 mL | INTRAMUSCULAR | @ 19:00:00 | Stop: 2022-11-10

## 2022-11-10 NOTE — Unmapped (Signed)
Patient here for Cabenuva injections. Denies changes in mood or sleep. Tolerated well. Next appointment made for 01/05/2023 .
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
## 2022-11-10 NOTE — Unmapped (Signed)
COVID Education:  Make sure you perform good hand washing (lasting 20 seconds), continue to social distance and limit close personal contact (which may include new sexual partners or having multiple partners during this period).  Try to isolate at home but please find ways to keep in touch with those close to you, such as meeting up with them electronically or socially distanced, and the ability to go outdoors alone or separated from others  If you become ill with fever, respiratory illness, sudden loss of taste and smell, stomach issues, diarrhea, nausea, vomiting - contact clinic for further instructions.  You should go to the emergency department if you develop systems such as shortness of breath, confusion, lightheadedness when standing, high fever.   Here is some information about HIV and CoVid vaccines: [email or web address]  If you're interested in receiving the CoVid vaccine when you're eligible, here are some resources for you to check and make an appointment:  Your local health department   [email or web address] through [facility]  [email or web address]  [facility] (if you are an established patient with them)  [email or web address]    URGENT CARE  Please call ahead to speak with the nursing staff if you are in need of an urgent appointment.       MEDICATIONS  For refills please contact your pharmacy and ask them to electronically send or fax the request to the clinic.   Please bring all medications in original bottles to every appointment.    HMAP (formerly ADAP) or [employer] Eligibility (required even if you do not receive medication through [facility])  Please remember to renew your Juanell Fairly eligibility during renewal periods which occur twice a year: January-March and July-September.     The following are needed for each renewal:   - [facility] Identification (if you don't have one, then a bill with your name and address in [place])   - proof of income (award letter, W-2, or last three check stubs)   If you are unable to come in for renewal, let us know if we can mail, fax or e-mail paperwork to you.   HMAP Contact: [phone].     Lab info:  Your most recent CD4 T-cell counts and viral loads are below. Here are a few things to keep in mind when looking at your numbers:  Our goal is to get your virus to be undetectable and keep it undetectable. If the virus is undetectable you are much more likely to stay healthy.  We consider your viral load to be undetectable if it says <40 or if it says Not detected.  For most people, we're checking CD4 counts every other visit (once or twice a year, or sometimes even less).  It's normal for your CD4 count to be different from visit to visit.   You can help by taking your medications at about the same time, every single day. If you're having trouble with taking your medications, it's important to let us know.    Lab Results   Component Value Date    ACD4 612 05/26/2022    CD4 36 05/26/2022    HIVCP <40 (H) 04/25/2021    HIVRS Not Detected 05/26/2022        Please note that your laboratory and other results may be visible to you in real time, possibly before they reach your provider. Please allow 48 hours for clinical interpretation of these results. Importantly, even if a result is flagged as abnormal, it may not be one that impacts  your health.    It was nice to have a visit with you today!  Follow-up information:        Provider today:  Varney Daily, FNP-BC      ID CLINIC address:   [facility] at [name]  [address]  [place], [place] [address]    Contact information:    The ID clinic phone number is [phone]   The ID clinic fax number is [phone]  For urgent issues on nights and weekends: Call the ID Physician on-call through the [facility] Operator at [phone].    Please sign up for My [facility] Chart - This is a great way to review your labs and track your appointments    Please try to arrive 30 minutes BEFORE your scheduled appointment time!  This will give you time to fill out any front desk paperwork needed for your visit, and allow you to be seen as close to your scheduled appointment time as possible.

## 2022-11-14 LAB — SYPHILIS SCREEN: SYPHILIS RPR SCREEN: NONREACTIVE

## 2022-12-10 DIAGNOSIS — B20 Human immunodeficiency virus [HIV] disease: Principal | ICD-10-CM

## 2022-12-12 NOTE — Unmapped (Signed)
[facility] Specialty Pharmacy Clinic Administered Medication Refill Coordination Note      NAME:Robert Moss DOB: November 11, 2001      Medication: [name]    Day Supply: 56 days      SHIPPING      Next delivery from [facility] [phone]) to  [facility]  for Robert Moss is scheduled for 02/08.    Clinic contact: BJ     Patient's next nurse visit for administration: 02/23.    We will follow up with clinic monthly for standard refill processing and delivery.      Donielle Kaigler Samella Parr  Specialty Pharmacy Technician

## 2022-12-20 MED FILL — CABENUVA 600 MG/3 ML-900 MG/3 ML IM SUSPENSION, EXTENDED RELEASE: INTRAMUSCULAR | 34 days supply | Qty: 6 | Fill #3

## 2022-12-21 NOTE — Unmapped (Signed)
Medical Case Management Social Work Note     Duration of Intervention: 30 minutes    TYPE OF CONTACT: Text    ASSESSMENT: Patient was due for a SW check in. Patient stated he is overall doing well and reports no concerns at this time.    ADHERENCE: Did not discuss at this interaction due to competing priorities and/or no concerns.    INTERVENTION:  SW provided active listening, validation, empathic feedback, unconditional positive regard, and congruence within the relationship.    PLAN:  SW will continue [facility] with patient.    Huxley Shurley, MSW  [place] [facility] Youth Social Work

## 2023-01-01 NOTE — Unmapped (Signed)
Attempted to contact patient to renew RW services, but no answer. Left voice message.      Mickle Asper,  Benefits & Eligibility Coordinator  Time of Intervention: 2 minutes

## 2023-01-01 NOTE — Unmapped (Signed)
Name: Robert Moss  Date: 01/01/2023  Address: [address]  [place] [address]   [place] of Residence:  [place]  Phone: [phone]     Started assessment with patient options: over the phone     Is this the same address for mailing? Yes  If No, Mailing Address is:     Librarian, academic    Tax Filing Status  I did not file taxes     Employment Status  Unemployed    Income  No Household Income/Deductions of any kind    If no or low income, how are you meeting your basic needs?  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    Do you have a current diagnosis for Hepatitis C?  No results found for: HEPCAB, HCVR, HCVRNA, HCVIU, HCVRNAIU, HCVGENOTYPE    Have you used tobacco products four or more times per week in the last six months?  No    Teacher, adult education  Patient has affordable insurance through [employer], [place], and or Employment and is not eligible.    Patient given ACA education if they qualified based on answers to questions above.     MyChart  Do you have an active MyChart account? Yes     If MyChart is not set up, informed patient on how to set up MyChart N/A    Patient was informed of the following programs;   N/A    The following applications/handouts were given to patient:   N/A    The following forms were also started with the patient:   N/A    Juanell Fairly application status: Complete    Patient is applying for [employer] on Charges Only     Additional Comments:       Mickle Asper,  Benefits & Eligibility Coordinator  Time of Intervention: 5 minutes

## 2023-01-02 NOTE — Unmapped (Addendum)
Returned call. Would like to come on Monday. Appt made for 2/26 at 2pm      ----- Message from Unk Lightning sent at 01/02/2023 12:31 PM EST -----  Regarding: Resch appt Feb 23rd  (Nurse Visit)  Pt would like to resch his appt for Feb 23rd (Nurse visit). Please reach out to pt asap. thanks

## 2023-01-04 NOTE — Unmapped (Signed)
Patient completed [employer] application. Eligible for RW B&C grant services and Caps on Charges. IPL= 0%, FPL= 0%. Expires 01/01/2024    RW Eligibility Form informing patient about RW services and Caps on charges was sent to patient via MyChart Message        Mickle Asper,  Benefits & Eligibility Coordinator  Time of Intervention: 2 minutes

## 2023-01-08 ENCOUNTER — Institutional Professional Consult (permissible substitution): Admit: 2023-01-08 | Discharge: 2023-01-09 | Payer: BLUE CROSS/BLUE SHIELD

## 2023-01-08 DIAGNOSIS — B20 Human immunodeficiency virus [HIV] disease: Principal | ICD-10-CM

## 2023-01-08 MED ADMIN — cabotegravir-rilpivirine (CABENUVA) 600 mg-900 mg/3 mL extended-release injection 6 mL: 6 mL | INTRAMUSCULAR | @ 20:00:00 | Stop: 2023-01-08

## 2023-01-08 NOTE — Unmapped (Signed)
Patient here for Cabenuva injections. Denies changes in mood or sleep. Tolerated well. Next appointment made for 03/05/2023.

## 2023-02-04 DIAGNOSIS — B20 Human immunodeficiency virus [HIV] disease: Principal | ICD-10-CM

## 2023-02-15 IMAGING — US US ABDOMEN LIMITED RUQ/ASCITES
1 series · 14 of 25 positions shown · non-contrast
Comparison: None.

CLINICAL DATA: Right upper quadrant pain.

EXAM:
ULTRASOUND ABDOMEN LIMITED RIGHT UPPER QUADRANT

[Series 1: us abdomen limited ruq/ascites · 14 of 72 slices shown]
[im 1/72]
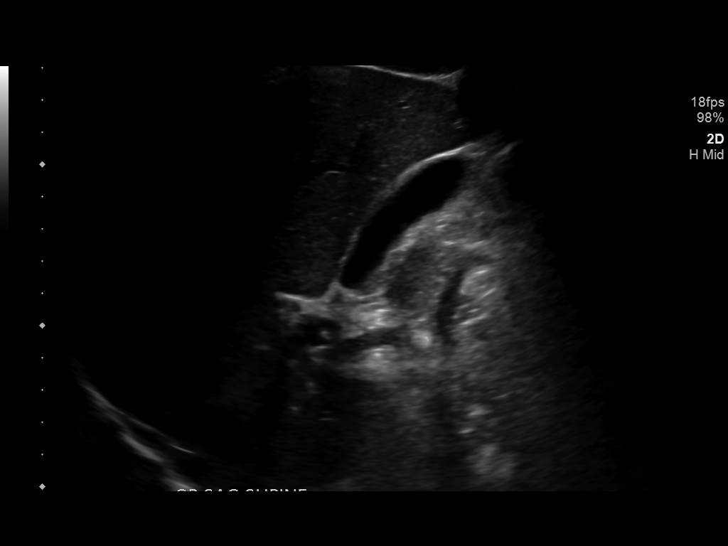
[im 6/72]
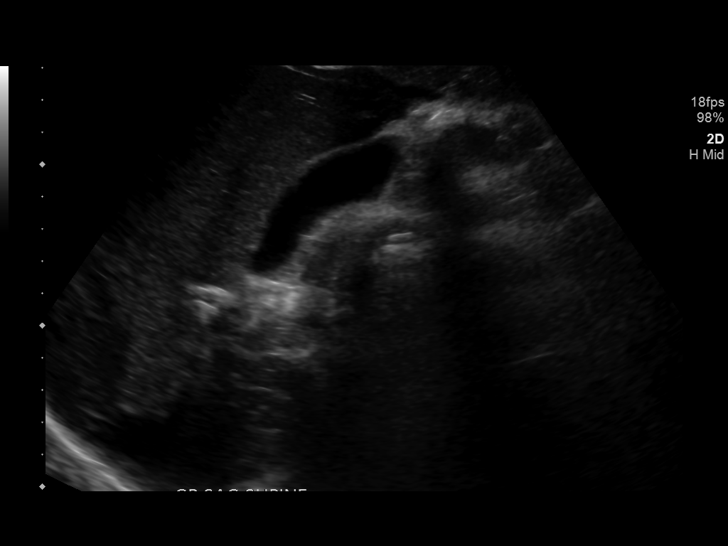
[im 12/72]
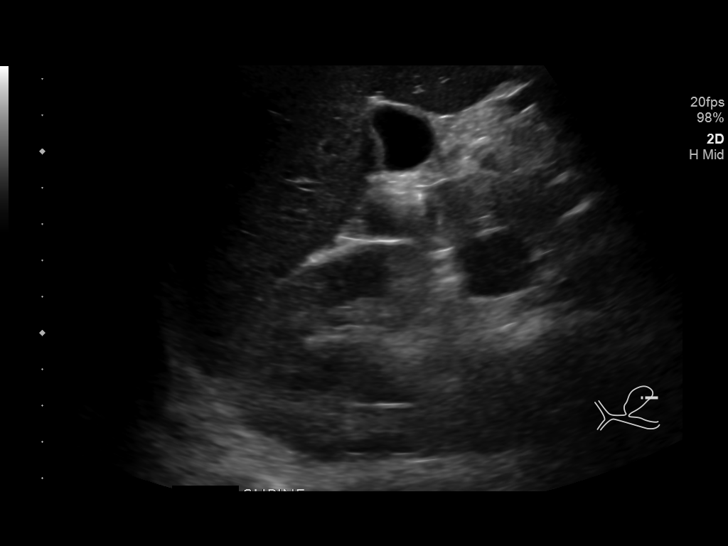
[im 18/72]
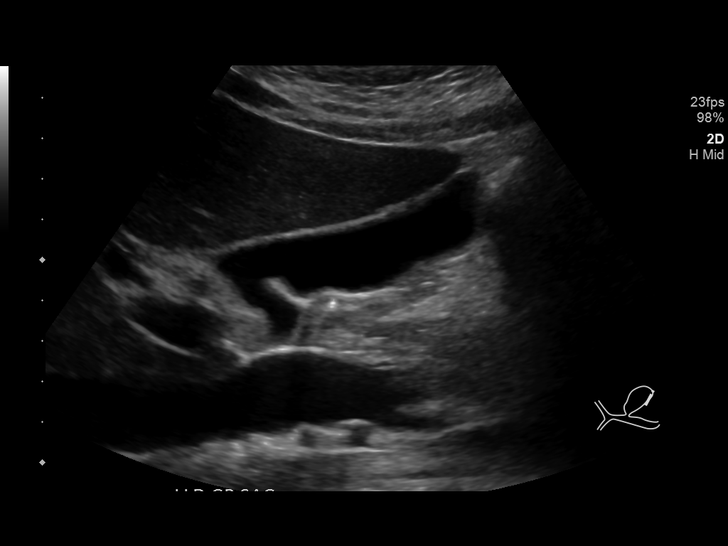
[im 24/72]
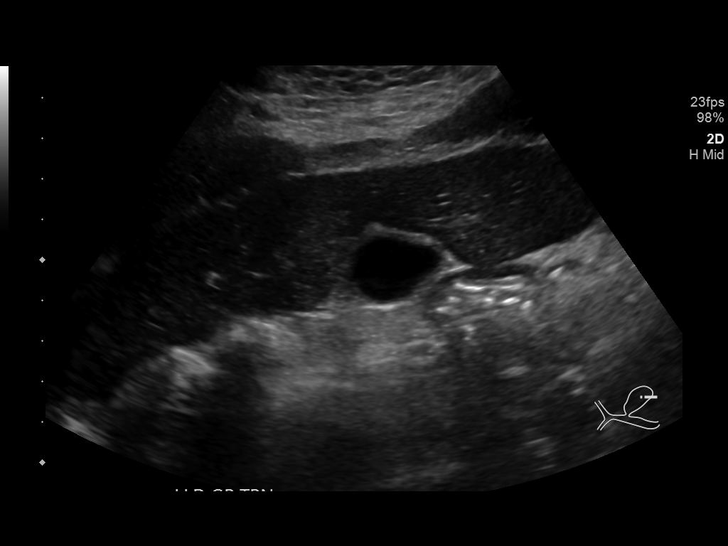
[im 27/72]
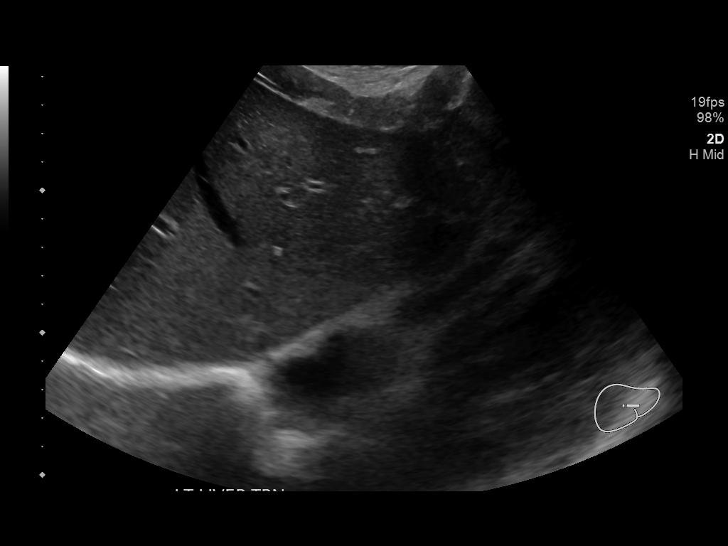
[im 33/72]
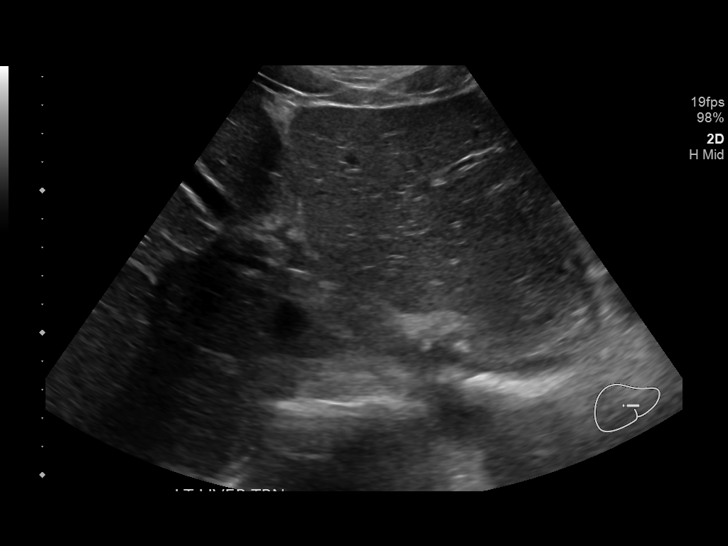
[im 39/72]
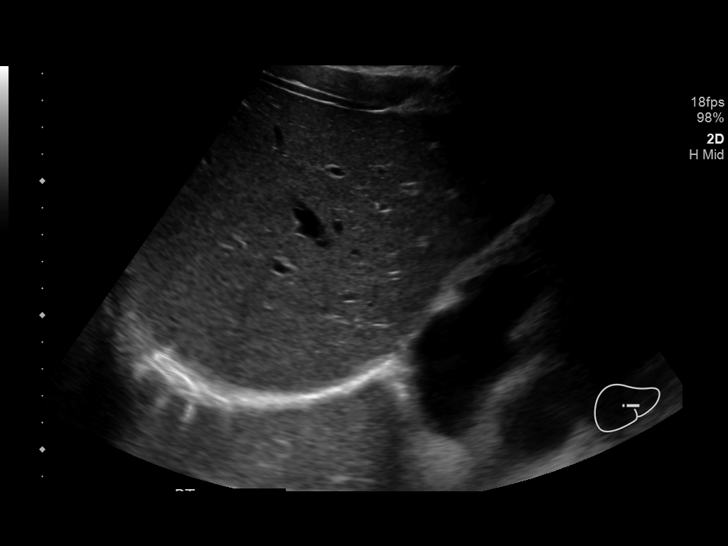
[im 45/72]
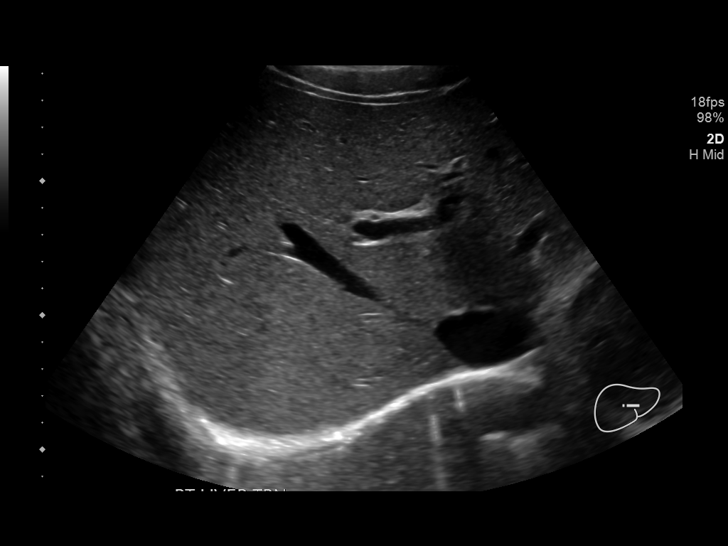
[im 48/72]
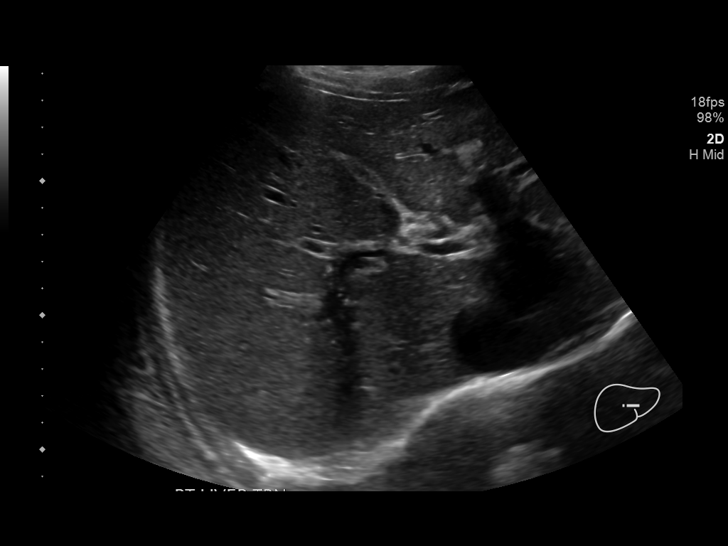
[im 54/72]
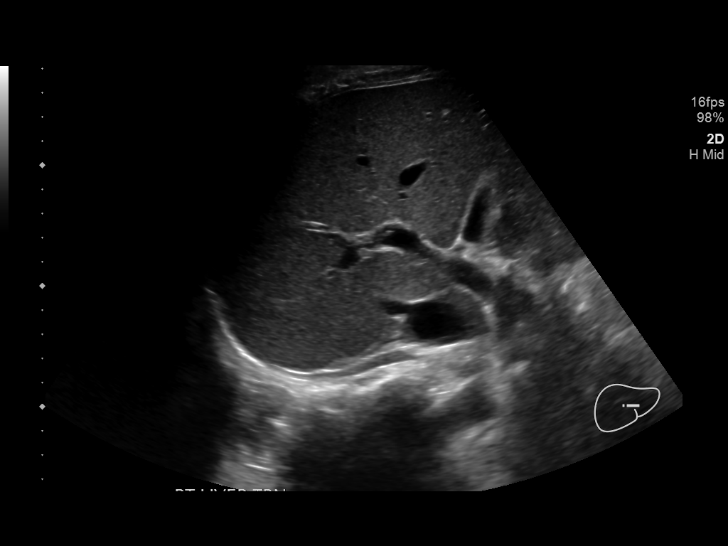
[im 60/72]
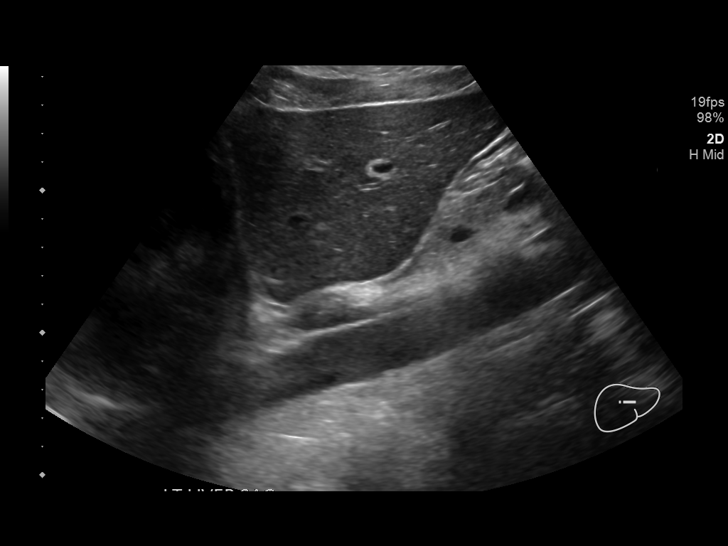
[im 66/72]
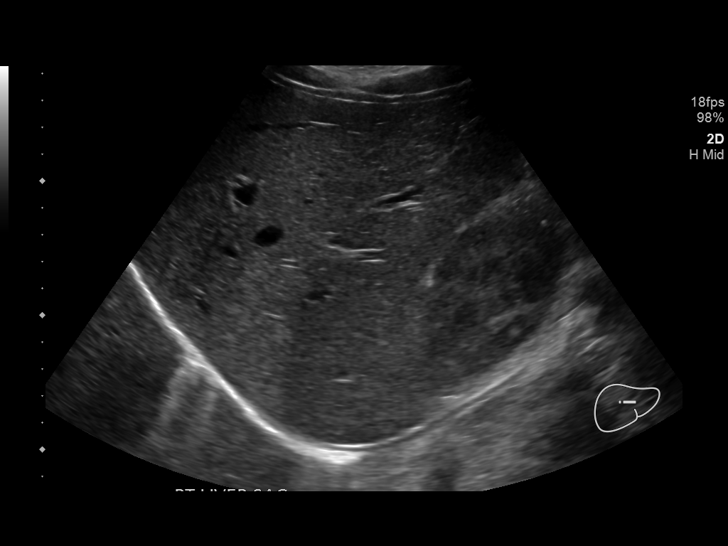
[im 72/72]
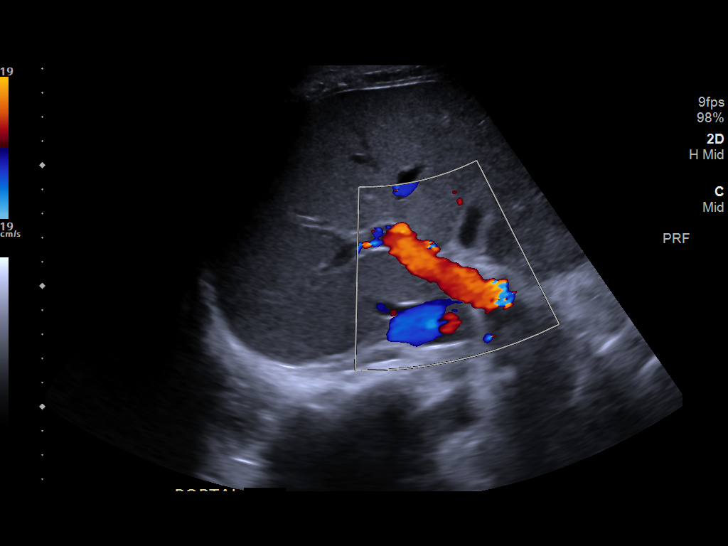

[14 of 25 positions shown; findings below may reference images not displayed]

FINDINGS: Gallbladder:

No gallstones or wall thickening visualized (2.1 mm). No sonographic
Murphy sign noted by sonographer.

Common bile duct:

Diameter: 2.9 mm

Liver:

No focal lesion identified. Within normal limits in parenchymal
echogenicity. Portal vein is patent on color Doppler imaging with
normal direction of blood flow towards the liver.

Other: None.
IMPRESSION: Normal right upper quadrant ultrasound.

## 2023-02-16 NOTE — Unmapped (Signed)
[facility] Administered Medication Refill Coordination Note      NAME:Rayyan Eliecer Mancine DOB: 06/05/2001      Medication: [name]    Day Supply: 56 days      SHIPPING      Next delivery from [facility] [phone]) to  [facility]  for Gurley Yoshi Leeb is scheduled for 04/09.    Clinic contact: BJ Turner    Patient's next nurse visit for administration: 04/22.    We will follow up with clinic monthly for standard refill processing and delivery.      Letizia Hook Samella Parr  Specialty Pharmacy Technician

## 2023-02-19 MED FILL — CABENUVA 600 MG/3 ML-900 MG/3 ML IM SUSPENSION, EXTENDED RELEASE: INTRAMUSCULAR | 34 days supply | Qty: 6 | Fill #4

## 2023-02-21 NOTE — Unmapped (Signed)
Medical Case Management Social Work Note     Duration of Intervention: 20 minutes    TYPE OF CONTACT: Text      MINI COMPREHENSIVE ASSESSMENT    ** MEDICAL NEEDS **  1. HIV Medical Needs: Patient did not specify at this time.  2. HIV Medication Adherence: No. Patient is on Cabenuva and has been attending scheduled appointments.  3. HIV Medical Care (engagement in care): Patient did not specify at this time.  4. Non-HIV Medical Needs: Patient did not specify at this time.    ** MH/SA NEEDS **  5. Mental Health: No, patient stated he is doing well. Patient stated, First year almost over at the end of this month, So excited for that!!  6. Drug/Alcohol Use: No    ** FISCAL NEEDS **  7. Financial Situation: Patient did not specify at this time.  8. Benefits/Insurance/Pharmacy Assistance: No    ** BASIC NEEDS **  9. Housing: Stable/Permanent  10. Transportation: Patient did not specify at this time.  11. Social Support System:  Patient did not specify at this time.  12. Food/Nutrition, Clothing, Utilities:  Patient did not specify at this time.    ** PREVENTION NEEDS **  13. Risk Reduction Counseling Needs:  Patient did not specify at this time.  14. Disclosure to Partners:  Patient did not specify at this time.    ** LEGAL NEEDS **  15.  Patient did not specify at this time.    ** RESOURCE COORDINATION **  16. Navigating social/health care systems:  Patient did not specify at this time.  17. Care coordination:  Patient did not specify at this time.    **ACTIVITIES OF DAILY LIVING  18. ADL assistance:  Patient did not specify at this time.      Enrolled in Medical Case Management:  yes       INTERVENTION:  SW provided active listening, validation, empathic feedback, unconditional positive regard, and congruence within the relationship.      Medical Case Management Care Plan    ID CARE PLAN - HEALTH MAINTENANCE    Need: Continued supportive health maintenance     Short Term Goal/Objective:   1. Continue to support patient's medication adherence   2. Continue to support patient's medical appointment adherence   3. Develop plan to address barriers as they arise by contacting social worker  4. Continue to engage with financial counselors to assist in ensuring eligibility of Juanell Fairly and [employer].     Person Responsible for Action: patient, Child psychotherapist, provider, financial counselors    Intervention/Activities/Actions:   1. Provide appointment reminders one week and one day prior to his appointment.   2. Provide follow up information regarding patient's lab results one week post appointment.   3. Provide support regarding medication adherence, risk reduction, or other counseling as needed.   4. Reinforcement of information as needed during clinic visits or by phone through calls and text messages.     Outcome:  1. Patient asks questions/seeks information to reinforce or expand understanding of HIV.   2. Document evidence in EPIC that patient has either attended next scheduled ID Clinic appointment or has called to reschedule  3. Patient takes prescribed medication on a daily basis as reported to SW or patient's medical provider.    This patient is being provided a service through [employer] of resort. A benefits investigation was done by social work staff and the patient was found to have no insurance  or that their health insurance does not cover the services necessary for patient success. In some cases, if health insurance does cover the service, social work staff serve as interim providers until a long term service provider can be found in cases where current service providers are not accessible due to affordability, availability, or specialist knowledge.     Rosine Solecki, LCSWA  [facility]

## 2023-03-05 ENCOUNTER — Institutional Professional Consult (permissible substitution): Admit: 2023-03-05 | Discharge: 2023-03-06 | Payer: BLUE CROSS/BLUE SHIELD

## 2023-03-05 DIAGNOSIS — Z113 Encounter for screening for infections with a predominantly sexual mode of transmission: Principal | ICD-10-CM

## 2023-03-05 DIAGNOSIS — B20 Human immunodeficiency virus [HIV] disease: Principal | ICD-10-CM

## 2023-03-05 DIAGNOSIS — Z5181 Encounter for therapeutic drug level monitoring: Principal | ICD-10-CM

## 2023-03-05 DIAGNOSIS — Z79899 Other long term (current) drug therapy: Principal | ICD-10-CM

## 2023-03-05 DIAGNOSIS — Z9189 Other specified personal risk factors, not elsewhere classified: Principal | ICD-10-CM

## 2023-03-05 LAB — ALT: ALT (SGPT): 10 U/L (ref 10–49)

## 2023-03-05 LAB — CBC W/ AUTO DIFF
BASOPHILS ABSOLUTE COUNT: 0 10*9/L (ref 0.0–0.1)
BASOPHILS RELATIVE PERCENT: 0.3 %
EOSINOPHILS ABSOLUTE COUNT: 0 10*9/L (ref 0.0–0.5)
EOSINOPHILS RELATIVE PERCENT: 0.3 %
HEMATOCRIT: 40.6 % (ref 39.0–48.0)
HEMOGLOBIN: 13.5 g/dL (ref 12.9–16.5)
LYMPHOCYTES ABSOLUTE COUNT: 2 10*9/L (ref 1.1–3.6)
LYMPHOCYTES RELATIVE PERCENT: 45.5 %
MEAN CORPUSCULAR HEMOGLOBIN CONC: 33.2 g/dL (ref 32.0–36.0)
MEAN CORPUSCULAR HEMOGLOBIN: 29.7 pg (ref 25.9–32.4)
MEAN CORPUSCULAR VOLUME: 89.4 fL (ref 77.6–95.7)
MEAN PLATELET VOLUME: 7.6 fL (ref 6.8–10.7)
MONOCYTES ABSOLUTE COUNT: 0.4 10*9/L (ref 0.3–0.8)
MONOCYTES RELATIVE PERCENT: 8.4 %
NEUTROPHILS ABSOLUTE COUNT: 2 10*9/L (ref 1.8–7.8)
NEUTROPHILS RELATIVE PERCENT: 45.5 %
PLATELET COUNT: 151 10*9/L (ref 150–450)
RED BLOOD CELL COUNT: 4.54 10*12/L (ref 4.26–5.60)
RED CELL DISTRIBUTION WIDTH: 13.6 % (ref 12.2–15.2)
WBC ADJUSTED: 4.4 10*9/L (ref 3.6–11.2)

## 2023-03-05 LAB — BASIC METABOLIC PANEL
ANION GAP: 4 mmol/L — ABNORMAL LOW (ref 5–14)
BLOOD UREA NITROGEN: 10 mg/dL (ref 9–23)
BUN / CREAT RATIO: 12
CALCIUM: 9.3 mg/dL (ref 8.7–10.4)
CHLORIDE: 109 mmol/L — ABNORMAL HIGH (ref 98–107)
CO2: 26.7 mmol/L (ref 20.0–31.0)
CREATININE: 0.83 mg/dL
EGFR CKD-EPI (2021) MALE: >90 mL/min/{1.73_m2} (ref >=60)
GLUCOSE RANDOM: 62 mg/dL — ABNORMAL LOW (ref 70–179)
POTASSIUM: 4.1 mmol/L (ref 3.4–4.8)
SODIUM: 140 mmol/L (ref 135–145)

## 2023-03-05 LAB — BILIRUBIN, TOTAL: BILIRUBIN TOTAL: 0.6 mg/dL (ref 0.3–1.2)

## 2023-03-05 LAB — AST: AST (SGOT): 15 U/L (ref <=34)

## 2023-03-05 MED ADMIN — cabotegravir-rilpivirine (CABENUVA) 600 mg-900 mg/3 mL extended-release injection 6 mL: 6 mL | INTRAMUSCULAR | @ 18:00:00 | Stop: 2023-03-05

## 2023-03-05 NOTE — Unmapped (Addendum)
Patient here for Cabenuva injections. Denies changes in mood or sleep. Tolerated well. 23 G 1 1/2 inch needles used for injection.  Next appointment made for 05/01/2023. Labs and STI testing were collected during this visit.

## 2023-03-06 LAB — HIV RNA, QUANTITATIVE, PCR: HIV RNA QNT RSLT: NOT DETECTED

## 2023-03-06 LAB — LYMPH MARKER LIMITED,FLOW
ABSOLUTE CD3 CNT: 1640 {cells}/uL (ref 915–3400)
ABSOLUTE CD4 CNT: 720 {cells}/uL (ref 510–2320)
ABSOLUTE CD8 CNT: 920 {cells}/uL (ref 180–1520)
CD3% (T CELLS): 82 % (ref 61–86)
CD4% (T HELPER): 36 % (ref 34–58)
CD4:CD8 RATIO: 0.8 — ABNORMAL LOW (ref 0.9–4.8)
CD8% T SUPPRESR: 46 % — ABNORMAL HIGH (ref 12–38)

## 2023-03-06 LAB — SYPHILIS SCREEN: SYPHILIS RPR SCREEN: NONREACTIVE

## 2023-03-07 DIAGNOSIS — A563 Chlamydial infection of anus and rectum: Principal | ICD-10-CM

## 2023-03-07 DIAGNOSIS — Z202 Contact with and (suspected) exposure to infections with a predominantly sexual mode of transmission: Principal | ICD-10-CM

## 2023-03-07 MED ORDER — DOXYCYCLINE MONOHYDRATE 100 MG CAPSULE
ORAL_CAPSULE | Freq: Once | ORAL | 2 refills | 0 days | Status: CP | PRN
Start: 2023-03-07 — End: 2023-03-14
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
## 2023-04-01 DIAGNOSIS — B20 Human immunodeficiency virus [HIV] disease: Principal | ICD-10-CM

## 2023-04-05 NOTE — Unmapped (Signed)
[facility] Administered Medication Refill Coordination Note      NAME:Robert Moss DOB: 2001/09/09      Medication: [name]    Day Supply:  34 days      SHIPPING      Next delivery from [facility] [phone]) to  [facility]  for Robert Moss is scheduled for 06/05.    Clinic contact: BJ Turner    Patient's next nurse visit for administration: 06/18.    We will follow up with clinic monthly for standard refill processing and delivery.      Arwin Bisceglia Samella Parr  Specialty Pharmacy Technician

## 2023-04-17 DIAGNOSIS — B20 Human immunodeficiency virus [HIV] disease: Principal | ICD-10-CM

## 2023-04-17 MED ORDER — CABENUVA 600 MG/3 ML-900 MG/3 ML IM SUSPENSION, EXTENDED RELEASE
INTRAMUSCULAR | 3 refills | 112.00000 days | Status: CN
Start: 2023-04-17 — End: ?

## 2023-04-18 DIAGNOSIS — B20 Human immunodeficiency virus [HIV] disease: Principal | ICD-10-CM

## 2023-04-18 MED ORDER — CABENUVA 600 MG/3 ML-900 MG/3 ML IM SUSPENSION, EXTENDED RELEASE
INTRAMUSCULAR | 3 refills | 112 days | Status: CP
Start: 2023-04-18 — End: ?
  Filled 2023-04-18: qty 6, 34d supply, fill #0

## 2023-04-30 NOTE — Unmapped (Signed)
From: Franki Cabot   Sent: 04/30/2023   2:23 PM EDT   To: *     Incoming Call   Caller: zack Jolinda Croak   Best callback number: [phone]   Reason for call: Wants to reschedule his cabaneuva shot for tomorrow       Returned call. Appt changed to 05/03/23

## 2023-05-03 ENCOUNTER — Institutional Professional Consult (permissible substitution): Admit: 2023-05-03 | Discharge: 2023-05-04 | Payer: BLUE CROSS/BLUE SHIELD

## 2023-05-03 ENCOUNTER — Ambulatory Visit: Admit: 2023-05-03 | Discharge: 2023-05-04 | Payer: BLUE CROSS/BLUE SHIELD | Attending: Family | Primary: Family

## 2023-05-03 DIAGNOSIS — Z113 Encounter for screening for infections with a predominantly sexual mode of transmission: Principal | ICD-10-CM

## 2023-05-03 DIAGNOSIS — Z202 Contact with and (suspected) exposure to infections with a predominantly sexual mode of transmission: Principal | ICD-10-CM

## 2023-05-03 DIAGNOSIS — B20 Human immunodeficiency virus [HIV] disease: Principal | ICD-10-CM

## 2023-05-03 MED ORDER — DOXYCYCLINE MONOHYDRATE 100 MG CAPSULE
ORAL_CAPSULE | Freq: Once | ORAL | 2 refills | 0.00000 days | Status: CP | PRN
Start: 2023-05-03 — End: 2023-05-03

## 2023-05-03 MED ADMIN — cabotegravir-rilpivirine (CABENUVA) 600 mg-900 mg/3 mL extended-release injection 6 mL: 6 mL | INTRAMUSCULAR | @ 20:00:00 | Stop: 2023-05-03

## 2023-05-03 MED ADMIN — cefTRIAXone (ROCEPHIN) 500 mg, lidocaine (PF) (XYLOCAINE-MPF) 10 mg/mL (1 %) 1.4286 mL injection: 500 mg | INTRAMUSCULAR | @ 21:00:00 | Stop: 2023-05-03

## 2023-05-03 NOTE — Unmapped (Signed)
Patient here for Cabenuva injections. Denies changes in mood or sleep. Tolerated well. Next appointment made for 06/28/2023.
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
## 2023-05-03 NOTE — Unmapped (Signed)
COVID Education:  Make sure you perform good hand washing (lasting 20 seconds), continue to social distance and limit close personal contact (which may include new sexual partners or having multiple partners during this period).  Try to isolate at home but please find ways to keep in touch with those close to you, such as meeting up with them electronically or socially distanced, and the ability to go outdoors alone or separated from others  If you become ill with fever, respiratory illness, sudden loss of taste and smell, stomach issues, diarrhea, nausea, vomiting - contact clinic for further instructions.  You should go to the emergency department if you develop systems such as shortness of breath, confusion, lightheadedness when standing, high fever.   Here is some information about HIV and CoVid vaccines: [email or web address]  If you're interested in receiving the CoVid vaccine when you're eligible, here are some resources for you to check and make an appointment:  Your local health department   [email or web address] through [facility]  [email or web address]  [facility] (if you are an established patient with them)  [email or web address]    URGENT CARE  Please call ahead to speak with the nursing staff if you are in need of an urgent appointment.       MEDICATIONS  For refills please contact your pharmacy and ask them to electronically send or fax the request to the clinic.   Please bring all medications in original bottles to every appointment.    HMAP (formerly ADAP) or [employer] Eligibility (required even if you do not receive medication through [facility])  Please remember to renew your Juanell Fairly eligibility during renewal periods which occur twice a year: January-March and July-September.     The following are needed for each renewal:   - [facility] Identification (if you don't have one, then a bill with your name and address in [place])   - proof of income (award letter, W-2, or last three check stubs)   If you are unable to come in for renewal, let us know if we can mail, fax or e-mail paperwork to you.   HMAP Contact: [phone].     Lab info:  Your most recent CD4 T-cell counts and viral loads are below. Here are a few things to keep in mind when looking at your numbers:  Our goal is to get your virus to be undetectable and keep it undetectable. If the virus is undetectable you are much more likely to stay healthy.  We consider your viral load to be undetectable if it says <40 or if it says Not detected.  For most people, we're checking CD4 counts every other visit (once or twice a year, or sometimes even less).  It's normal for your CD4 count to be different from visit to visit.   You can help by taking your medications at about the same time, every single day. If you're having trouble with taking your medications, it's important to let us know.    Lab Results   Component Value Date    ACD4 720 03/05/2023    CD4 36 03/05/2023    HIVCP <40 (H) 04/25/2021    HIVRS Not Detected 03/05/2023        Please note that your laboratory and other results may be visible to you in real time, possibly before they reach your provider. Please allow 48 hours for clinical interpretation of these results. Importantly, even if a result is flagged as abnormal, it may not be one that impacts  your health.    It was nice to have a visit with you today!  Follow-up information:  You need a tdap and Menveo #1      Provider today:  Varney Daily, FNP-BC      ID CLINIC address:   [facility]  [address]  [place], [place] [address]    Contact information:    The ID clinic phone number is [phone]   The ID clinic fax number is [phone]  For urgent issues on nights and weekends: Call the ID Physician on-call through the [facility] Operator at [phone].    Please sign up for My [facility] Chart - This is a great way to review your labs and track your appointments    Please try to arrive 30 minutes BEFORE your scheduled appointment time!  This will give you time to fill out any front desk paperwork needed for your visit, and allow you to be seen as close to your scheduled appointment time as possible.

## 2023-05-04 LAB — SYPHILIS SCREEN: SYPHILIS RPR SCREEN: NONREACTIVE

## 2023-05-04 NOTE — Unmapped (Signed)
PROMIS Tablet Screening  Completed Date: 11/10/2022     SW reviewed self-administered screening.    Patient had a PHQ-9 score of 0  indicating None-Minimal depression.   Pt denies SI.   Pt scored 2 on AUDIT/AUDIT-C indicating Not-at-risk alcohol consumption  Pt  denies substance use in past 3 months.  Pt denies concerns for IPV.    Pt seen by provider for regular ID visit.  Pt was not seen in person by Social Work during this visit.       Cynithia Hakimi, LCSWA  [place] [facility] Youth Social Work

## 2023-05-07 MED ORDER — DOXYCYCLINE MONOHYDRATE 100 MG CAPSULE
ORAL_CAPSULE | Freq: Once | ORAL | 2 refills | 0 days | Status: CP | PRN
Start: 2023-05-07 — End: ?

## 2023-05-10 NOTE — Unmapped (Signed)
[facility] Administered Medication Refill Coordination Note      NAME:Robert Moss DOB: [birth year]      Medication: [name]    Day Supply: 56 days      SHIPPING      Next delivery from [facility] [phone]) to  [facility]  for Robert Moss is scheduled for 07/09.    Clinic contact: BJ Turner    Patient's next nurse visit for administration: 0815.    We will follow up with clinic monthly for standard refill processing and delivery.      Robert Moss  Specialty Pharmacy Technician

## 2023-05-21 MED FILL — CABENUVA 600 MG/3 ML-900 MG/3 ML IM SUSPENSION, EXTENDED RELEASE: INTRAMUSCULAR | 34 days supply | Qty: 6 | Fill #1

## 2023-06-17 DIAGNOSIS — B20 Human immunodeficiency virus [HIV] disease: Principal | ICD-10-CM

## 2023-06-18 NOTE — Unmapped (Signed)
Medical Case Management Attempt Social Work Note     Duration of Intervention: 5 minutes    REASON/TYPE OF CONTACT: Text    INTERVENTION:  SW attempted to contact for services but no answer.     PLAN:  SW will try to contact again at a later date.    Jazira Maloney, LCSWA  [place] [facility] Youth Social Work

## 2023-06-28 ENCOUNTER — Institutional Professional Consult (permissible substitution): Admit: 2023-06-28 | Discharge: 2023-06-29 | Payer: BLUE CROSS/BLUE SHIELD

## 2023-06-28 DIAGNOSIS — B20 Human immunodeficiency virus [HIV] disease: Principal | ICD-10-CM

## 2023-06-28 MED ADMIN — cabotegravir-rilpivirine (CABENUVA) 600 mg-900 mg/3 mL extended-release injection 6 mL: 6 mL | INTRAMUSCULAR | @ 15:00:00 | Stop: 2023-06-28

## 2023-06-28 NOTE — Unmapped (Signed)
Patient here for Cabenuva injections. Denies changes in mood or sleep. Tolerated well. Needle size: 23G 1 1/2 Next appointment made for 10/10 also with provider.

## 2023-07-11 NOTE — Unmapped (Signed)
[facility] Administered Medication Refill Coordination Note      NAME:Kauan Loomis Yarber DOB: September 19, 2001      Medication: [name]    Day Supply: 56 days      SHIPPING      Next delivery from [facility] [phone]) to  [facility]  for Antoin Egbert Bernales is scheduled for 09/05.    Clinic contact: BJ Turner    Patient's next nurse visit for administration: 10/10.    We will follow up with clinic monthly for standard refill processing and delivery.      Deontrey Massi Samella Parr  Specialty Pharmacy Technician

## 2023-07-18 MED FILL — CABENUVA 600 MG/3 ML-900 MG/3 ML IM SUSPENSION, EXTENDED RELEASE: INTRAMUSCULAR | 34 days supply | Qty: 6 | Fill #2

## 2023-07-22 DIAGNOSIS — B20 Human immunodeficiency virus [HIV] disease: Principal | ICD-10-CM

## 2023-07-30 NOTE — Unmapped (Signed)
Medical Case Management Social Work Note     Duration of Intervention: 20 minutes    TYPE OF CONTACT: Text      MINI COMPREHENSIVE ASSESSMENT    ** MEDICAL NEEDS **  1. HIV Medical Needs: No  2. HIV Medication Adherence: No, patient is attending scheduled Cabenuva appointments.  3. HIV Medical Care (engagement in care): No  4. Non-HIV Medical Needs: No    ** MH/SA NEEDS **  5. Mental Health: No, patient stated, mentally I've been really good actually. Truly taking everyday as it comes.  6. Drug/Alcohol Use: No    ** FISCAL NEEDS **  7. Financial Situation: Patient did not specify at this time.  8. Benefits/Insurance/Pharmacy Assistance: No, patient has Medicaid.    ** BASIC NEEDS **  9. Housing: Stable/Permanent, patient is currently enrolled in college.   10. Transportation: No  11. Social Support System: Patient did not specify at this time.  12. Food/Nutrition, Clothing, Utilities: Patient did not specify at this time.    ** PREVENTION NEEDS **  13. Risk Reduction Counseling Needs: No  14. Disclosure to Partners: No, patient stated he is currently taking a break from dating.    ** LEGAL NEEDS **  15. No    ** RESOURCE COORDINATION **  16. Navigating social/health care systems: Patient did not specify at this time.  17. Care coordination:  Patient did not specify at this time.    **ACTIVITIES OF DAILY LIVING  18. ADL assistance:  Patient did not specify at this time.      Enrolled in Medical Case Management:  yes       INTERVENTION:  SW provided active listening, validation, empathic feedback, unconditional positive regard, and congruence within the relationship.     Medical Case Management Care Plan    ID CARE PLAN - HEALTH MAINTENANCE    Need: Continued supportive health maintenance     Short Term Goal/Objective:   1. Continue to support patient's medication adherence   2. Continue to support patient's medical appointment adherence   3. Develop plan to address barriers as they arise by contacting social worker  4. Continue to engage with financial counselors to assist in ensuring eligibility of Juanell Fairly and [employer].   5. Continue to advocate for needs to be addressed with all clinical staff.  6. Continue to inform Social worker of questions or concerns.    Person Responsible for Action: patient, Child psychotherapist, provider, financial counselors    Intervention/Activities/Actions:   1. Provide appointment reminders one week and one day prior to his appointment.   2. Provide follow up information regarding patient's lab results one week post appointment.   3. Provide support regarding medication adherence, risk reduction, or other counseling as needed.   4. Reinforcement of information as needed during clinic visits or by phone through calls and text messages.   5. SW will continue to advocate for patient.    Outcome:  1. Patient asks questions/seeks information to reinforce or expand understanding of HIV.   2. Document evidence in EPIC that patient has either attended next scheduled ID Clinic appointment or has called to reschedule  3. Patient takes prescribed medication on a daily basis as reported to SW or patient's medical provider.  4. Patient will be able to advocate/address concerns as they arise.    This patient is being provided a service through [employer] of resort. A benefits investigation was done by social work staff and the patient was  found to have no insurance or that their health insurance does not cover the services necessary for patient success. In some cases, if health insurance does cover the service, social work staff serve as interim providers until a long term service provider can be found in cases where current service providers are not accessible due to affordability, availability, or specialist knowledge.     Jermany Sundell, LCSWA  [place] [facility] Youth Social Work

## 2023-08-14 DIAGNOSIS — B20 Human immunodeficiency virus [HIV] disease: Principal | ICD-10-CM

## 2023-08-23 ENCOUNTER — Ambulatory Visit: Admit: 2023-08-23 | Discharge: 2023-08-24 | Payer: BLUE CROSS/BLUE SHIELD | Attending: Family | Primary: Family

## 2023-08-23 ENCOUNTER — Institutional Professional Consult (permissible substitution): Admit: 2023-08-23 | Discharge: 2023-08-24 | Payer: BLUE CROSS/BLUE SHIELD

## 2023-08-23 DIAGNOSIS — B20 Human immunodeficiency virus [HIV] disease: Principal | ICD-10-CM

## 2023-08-23 DIAGNOSIS — Z113 Encounter for screening for infections with a predominantly sexual mode of transmission: Principal | ICD-10-CM

## 2023-08-23 DIAGNOSIS — Z1321 Encounter for screening for nutritional disorder: Principal | ICD-10-CM

## 2023-08-23 DIAGNOSIS — Z79899 Other long term (current) drug therapy: Principal | ICD-10-CM

## 2023-08-23 DIAGNOSIS — Z9189 Other specified personal risk factors, not elsewhere classified: Principal | ICD-10-CM

## 2023-08-23 DIAGNOSIS — Z5181 Encounter for therapeutic drug level monitoring: Principal | ICD-10-CM

## 2023-08-23 DIAGNOSIS — Z202 Contact with and (suspected) exposure to infections with a predominantly sexual mode of transmission: Principal | ICD-10-CM

## 2023-08-23 DIAGNOSIS — Z23 Encounter for immunization: Principal | ICD-10-CM

## 2023-08-23 LAB — CBC W/ AUTO DIFF
BASOPHILS ABSOLUTE COUNT: 0 10*9/L (ref 0.0–0.1)
BASOPHILS RELATIVE PERCENT: 0.5 %
EOSINOPHILS ABSOLUTE COUNT: 0 10*9/L (ref 0.0–0.5)
EOSINOPHILS RELATIVE PERCENT: 0.5 %
HEMATOCRIT: 43.5 % (ref 39.0–48.0)
HEMOGLOBIN: 14.1 g/dL (ref 12.9–16.5)
LYMPHOCYTES ABSOLUTE COUNT: 1.4 10*9/L (ref 1.1–3.6)
LYMPHOCYTES RELATIVE PERCENT: 32.6 %
MEAN CORPUSCULAR HEMOGLOBIN CONC: 32.5 g/dL (ref 32.0–36.0)
MEAN CORPUSCULAR HEMOGLOBIN: 28.7 pg (ref 25.9–32.4)
MEAN CORPUSCULAR VOLUME: 88.3 fL (ref 77.6–95.7)
MEAN PLATELET VOLUME: 7.7 fL (ref 6.8–10.7)
MONOCYTES ABSOLUTE COUNT: 0.4 10*9/L (ref 0.3–0.8)
MONOCYTES RELATIVE PERCENT: 10.5 %
NEUTROPHILS ABSOLUTE COUNT: 2.3 10*9/L (ref 1.8–7.8)
NEUTROPHILS RELATIVE PERCENT: 55.9 %
PLATELET COUNT: 180 10*9/L (ref 150–450)
RED BLOOD CELL COUNT: 4.93 10*12/L (ref 4.26–5.60)
RED CELL DISTRIBUTION WIDTH: 13.5 % (ref 12.2–15.2)
WBC ADJUSTED: 4.2 10*9/L (ref 3.6–11.2)

## 2023-08-23 LAB — URINALYSIS WITH MICROSCOPY WITH CULTURE REFLEX PERFORMABLE
BACTERIA: NONE SEEN /HPF
BILIRUBIN UA: NEGATIVE
BLOOD UA: NEGATIVE
GLUCOSE UA: NEGATIVE
KETONES UA: NEGATIVE
LEUKOCYTE ESTERASE UA: NEGATIVE
NITRITE UA: NEGATIVE
PH UA: 7.5 (ref 5.0–9.0)
RBC UA: 1 /HPF (ref <=3)
SPECIFIC GRAVITY UA: 1.027 (ref 1.003–1.030)
SQUAMOUS EPITHELIAL: <1 /HPF (ref 0–5)
UROBILINOGEN UA: <2
WBC UA: <1 /HPF (ref <=2)

## 2023-08-23 LAB — HIV RNA, QUANTITATIVE, PCR: HIV RNA QNT RSLT: NOT DETECTED

## 2023-08-23 LAB — ALT: ALT (SGPT): 14 U/L (ref 10–49)

## 2023-08-23 LAB — BASIC METABOLIC PANEL
ANION GAP: 6 mmol/L (ref 5–14)
BLOOD UREA NITROGEN: 7 mg/dL — ABNORMAL LOW (ref 9–23)
BUN / CREAT RATIO: 7
CALCIUM: 9.8 mg/dL (ref 8.7–10.4)
CHLORIDE: 108 mmol/L — ABNORMAL HIGH (ref 98–107)
CO2: 27.7 mmol/L (ref 20.0–31.0)
CREATININE: 0.99 mg/dL
EGFR CKD-EPI (2021) MALE: >90 mL/min/{1.73_m2} (ref >=60)
GLUCOSE RANDOM: 93 mg/dL (ref 70–99)
POTASSIUM: 4.1 mmol/L (ref 3.4–4.8)
SODIUM: 142 mmol/L (ref 135–145)

## 2023-08-23 LAB — BILIRUBIN, TOTAL: BILIRUBIN TOTAL: 0.6 mg/dL (ref 0.3–1.2)

## 2023-08-23 LAB — AST: AST (SGOT): 14 U/L (ref <=34)

## 2023-08-23 MED ORDER — DOXYCYCLINE MONOHYDRATE 100 MG CAPSULE
ORAL_CAPSULE | Freq: Once | ORAL | 2 refills | 0 days | Status: CP | PRN
Start: 2023-08-23 — End: ?

## 2023-08-23 MED ADMIN — cabotegravir-rilpivirine (CABENUVA) 600 mg-900 mg/3 mL extended-release injection 6 mL: 6 mL | INTRAMUSCULAR | @ 19:00:00 | Stop: 2023-08-23

## 2023-08-23 NOTE — Unmapped (Signed)
Patient here for Cabenuva injections. Denies changes in mood or sleep. Tolerated well. Needle size: 23G 1 1/2 Next appointment made for 10/18/2023.

## 2023-08-23 NOTE — Unmapped (Signed)
Name: Robert Moss  Date: 08/23/2023  Address: [address]  [place] [address]   [place] of Residence:  [place]  Phone: [phone]     Started assessment with patient options: in clinic    Is this the same address for mailing? Yes  If no, Mailing Address is:     What is your preferred method of contact? Phone Call    Is there anyone that you would want to add as your personal contact? No; if yes, please use SmartPhrase RWPersonalContactInfo to gather their contacts information.    N/A    Housing Status  Stable/Permanent; if so, what is their housing type: Renting and living - room, house, or apartment    Insurance  Medicaid    Marital Status  Single    Tax Press photographer Status  Single    Employment Status  Employed Part Time     Income  Salary/Wages    If no or low income, how are you meeting your basic needs?  Not Applicable    List Tax Household Members including relationship to you:   N/A    Someone in my household receives: Not Applicable (for household members)  Specify who: N/A    Do you or anyone in your home have income adjustments? No    If yes, which adjustments do you have? N/A      Medication Access/Barriers: None, medicaid    Do you have a current diagnosis for Hepatitis C?  No results found for: HEPCAB, HCVR, HCVRNA, HCVIU, HCVRNAIU, HCVGENOTYPE    Federal Marketplace Eligibility Assessment  Patient has affordable insurance through [employer], [place], and or Employment and is not eligible.    Patient given ACA education if they qualified based on answers to questions above.     MyChart  Do you have an active MyChart account? Yes     If MyChart is not set up, informed patient on how to set up MyChart N/A    Patient was informed of the following programs;   N/A    The following applications/handouts were given to patient:   N/A    The following forms were also started with the patient:   N/A    Medicaid:  N/A      [place]/HMAP application status: Incomplete; patient needs to send proof of income (2 paystubs)    Patient is applying for [employer] on Charges Only     Additional Comments: Copywriter, advertising message for proof of income.        Mickle Asper,  Benefits & Eligibility Coordinator  Time of Intervention: 7 minutes
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
## 2023-08-23 NOTE — Unmapped (Signed)
COVID Education:  Make sure you perform good hand washing (lasting 20 seconds), continue to social distance and limit close personal contact (which may include new sexual partners or having multiple partners during this period).  Try to isolate at home but please find ways to keep in touch with those close to you, such as meeting up with them electronically or socially distanced, and the ability to go outdoors alone or separated from others  If you become ill with fever, respiratory illness, sudden loss of taste and smell, stomach issues, diarrhea, nausea, vomiting - contact clinic for further instructions.  You should go to the emergency department if you develop systems such as shortness of breath, confusion, lightheadedness when standing, high fever.   Here is some information about HIV and CoVid vaccines: [email or web address]  If you're interested in receiving the CoVid vaccine when you're eligible, here are some resources for you to check and make an appointment:  Your local health department   [email or web address] through [facility]  [email or web address]  [facility] (if you are an established patient with them)  [email or web address]    URGENT CARE  Please call ahead to speak with the nursing staff if you are in need of an urgent appointment.       MEDICATIONS  For refills please contact your pharmacy and ask them to electronically send or fax the request to the clinic.   Please bring all medications in original bottles to every appointment.    HMAP (formerly ADAP) or [employer] Eligibility (required even if you do not receive medication through [facility])  Please remember to renew your Juanell Fairly eligibility during renewal periods which occur twice a year: January-March and July-September.     The following are needed for each renewal:   - [employer] Identification (if you don't have one, then a bill with your name and address in [place])   - proof of income (award letter, W-2, or last three check stubs)   If you are unable to come in for renewal, let us know if we can mail, fax or e-mail paperwork to you.   HMAP Contact: [phone].     Lab info:  Your most recent CD4 T-cell counts and viral loads are below. Here are a few things to keep in mind when looking at your numbers:  Our goal is to get your virus to be undetectable and keep it undetectable. If the virus is undetectable you are much more likely to stay healthy.  We consider your viral load to be undetectable if it says <40 or if it says Not detected.  For most people, we're checking CD4 counts every other visit (once or twice a year, or sometimes even less).  It's normal for your CD4 count to be different from visit to visit.   You can help by taking your medications at about the same time, every single day. If you're having trouble with taking your medications, it's important to let us know.    Lab Results   Component Value Date    ACD4 720 03/05/2023    CD4 36 03/05/2023    HIVCP <40 (H) 04/25/2021    HIVRS Not Detected 03/05/2023        Please note that your laboratory and other results may be visible to you in real time, possibly before they reach your provider. Please allow 48 hours for clinical interpretation of these results. Importantly, even if a result is flagged as abnormal, it may not be one that impacts  your health.    It was nice to have a visit with you today!  Follow-up information:        Provider today:  Varney Daily, FNP-BC      ID CLINIC address:   [facility] Infectious Diseases Clinic at [facility]  [address]  [place], [place] [address]    Contact information:    The ID clinic phone number is [phone]   The ID clinic fax number is [phone]  For urgent issues on nights and weekends: Call the ID Physician on-call through the [facility] Operator at [phone].    Please sign up for My [employer] Chart - This is a great way to review your labs and track your appointments    Please try to arrive 30 minutes BEFORE your scheduled appointment time!  This will give you time to fill out any front desk paperwork needed for your visit, and allow you to be seen as close to your scheduled appointment time as possible.

## 2023-08-24 LAB — SYPHILIS SCREEN: SYPHILIS RPR SCREEN: NONREACTIVE

## 2023-08-27 LAB — VITAMIN D 25 HYDROXY: VITAMIN D, TOTAL (25OH): 16.5 ng/mL — ABNORMAL LOW (ref 20.0–80.0)

## 2023-09-06 NOTE — Unmapped (Signed)
Robert Moss has been contacted in regards to their refill of Cabenuva . At this time, they have declined refill due to  per BJ Turner @ clinic pt not due nxt injection til 12/05 will follow-up in 1 month  . Refill assessment call date has been updated per the patient's request.

## 2023-09-16 DIAGNOSIS — B20 Human immunodeficiency virus [HIV] disease: Principal | ICD-10-CM

## 2023-09-21 NOTE — Unmapped (Signed)
Robert Moss called to switch his appt from 12/5 to 12/2.  Appt changed.

## 2023-10-06 NOTE — Unmapped (Signed)
[facility] Specialty and Home Delivery Pharmacy Clinic Administered Medication Refill Coordination Note      NAME:Robert Moss DOB: [birth year]      Medication: [name]    Day Supply: 56 days      SHIPPING      Next delivery from [facility] [phone]) to [facility] Infectious Disease Clinic at [facility] for Robert Moss is scheduled for 11/27.    Clinic contact: BJ Turner    Patient's next nurse visit for administration: 12/02.    We will follow up with clinic monthly for standard refill processing and delivery.      Robert Moss  [facility] Specialty and [facility]

## 2023-10-09 MED FILL — CABENUVA 600 MG/3 ML-900 MG/3 ML IM SUSPENSION, EXTENDED RELEASE: INTRAMUSCULAR | 34 days supply | Qty: 6 | Fill #3

## 2023-10-14 DIAGNOSIS — B20 Human immunodeficiency virus [HIV] disease: Principal | ICD-10-CM

## 2023-10-15 ENCOUNTER — Institutional Professional Consult (permissible substitution): Admit: 2023-10-15 | Discharge: 2023-10-16 | Payer: BLUE CROSS/BLUE SHIELD

## 2023-10-15 DIAGNOSIS — B20 Human immunodeficiency virus [HIV] disease: Principal | ICD-10-CM

## 2023-10-15 MED ADMIN — cabotegravir-rilpivirine (CABENUVA) 600 mg-900 mg/3 mL extended-release injection 6 mL: 6 mL | INTRAMUSCULAR | @ 14:00:00 | Stop: 2023-10-15

## 2023-10-15 NOTE — Unmapped (Signed)
Patient here for Cabenuva injections. Denies changes in mood or sleep. Tolerated well. Needle size: 23G 1 1/2 Next appointment made for 1/31.

## 2023-11-02 NOTE — Unmapped (Signed)
Medical Case Management Attempt Social Work Note     Duration of Intervention: 5 minutes    REASON/TYPE OF CONTACT: Text    INTERVENTION:  SW attempted to contact for services but no answer.     PLAN:  SW will try to contact again at a later date.    Sherl Yzaguirre, LCSWA   ID Youth Social Work

## 2023-11-24 DIAGNOSIS — B20 Human immunodeficiency virus [HIV] disease: Principal | ICD-10-CM

## 2023-11-30 NOTE — Unmapped (Signed)
[facility] Specialty and Home Delivery Pharmacy Clinic Administered Medication Refill Coordination Note      NAME:Robert Moss DOB: [birth year]      Medication: [name]    Day Supply: 56 days      SHIPPING      Next delivery from [facility] [phone]) to [facility] Infectious Disease Clinic at [facility] for Robert Moss is scheduled for 01/23.    Clinic contact: BJ Turner    Patient's next nurse visit for administration: 01/31.    We will follow up with clinic monthly for standard refill processing and delivery.      Robert Moss  [facility] Specialty and [facility]

## 2023-12-06 MED FILL — CABENUVA 600 MG/3 ML-900 MG/3 ML IM SUSPENSION, EXTENDED RELEASE: INTRAMUSCULAR | 34 days supply | Qty: 6 | Fill #4

## 2023-12-14 ENCOUNTER — Ambulatory Visit: Admit: 2023-12-14 | Discharge: 2023-12-15 | Payer: BLUE CROSS/BLUE SHIELD

## 2023-12-14 DIAGNOSIS — B20 Human immunodeficiency virus [HIV] disease: Principal | ICD-10-CM

## 2023-12-14 MED ADMIN — cabotegravir-rilpivirine (CABENUVA) 600 mg-900 mg/3 mL extended-release injection 6 mL: 6 mL | INTRAMUSCULAR | @ 19:00:00 | Stop: 2023-12-14

## 2023-12-14 NOTE — Unmapped (Signed)
Patient here for Cabenuva injections. Denies changes in mood or sleep. Tolerated well. Needle size: 23G 1 1/2 Next appointment made for 02/12/2024.

## 2023-12-24 NOTE — Unmapped (Signed)
Medical Case Management Attempt Social Work Note     Duration of Intervention: 5 minutes    REASON/TYPE OF CONTACT: Text    INTERVENTION:  SW attempted to contact for services but no answer.     PLAN:  SW will try to contact again at a later date.    Tyrihanna Wingert, LCSWA  [place] ID Youth Social Work

## 2024-01-16 DIAGNOSIS — B20 Human immunodeficiency virus [HIV] disease: Principal | ICD-10-CM

## 2024-01-18 NOTE — Unmapped (Signed)
 Medical Case Management Social Work Note     Duration of Intervention: 25 minutes    TYPE OF CONTACT: Text    ASSESSMENT: Patient was due for a SW check in. Patient stated he is doing well. Patient stated he is graduating in May. Patient stated he plans on moving back to [facility] after graduation for work.    ADHERENCE: Did not discuss at this interaction due to competing priorities and/or no concerns.    INTERVENTION:  SW provided active listening, validation, empathic feedback, unconditional positive regard, and congruence within the relationship. SW attempted to further assess, however, patient did not respond.    PLAN:  SW will continue [facility] with patient.       Ariannah Arenson, LCSWA  [place] ID Youth Social Work

## 2024-01-24 NOTE — Unmapped (Addendum)
 [facility] Specialty and Home Delivery Pharmacy Clinic Administered Medication Refill Coordination Note      NAME:Robert Moss DOB: 02-03-2001      Medication: [name]    Day Supply: 56 days      SHIPPING      Next delivery from [facility] [phone]) to [facility] Infectious Disease Clinic at [facility] for Robert Moss is scheduled for 03/26.    Clinic contact: BJ Turner    Patient's next nurse visit for administration: 04/01.    We will follow up with clinic monthly for standard refill processing and delivery.      Robert Moss  [facility] Specialty and [facility]

## 2024-01-28 NOTE — Unmapped (Signed)
 Caller: Robert Moss   Best callback number: [phone]   Reason for call: Patient wants to reschedule his cabaneuva shot     Returned my call. Appt for injections now match provider date.

## 2024-02-05 MED FILL — CABENUVA 600 MG/3 ML-900 MG/3 ML IM SUSPENSION, EXTENDED RELEASE: INTRAMUSCULAR | 34 days supply | Qty: 6 | Fill #5

## 2024-02-12 ENCOUNTER — Ambulatory Visit: Admit: 2024-02-12 | Discharge: 2024-02-12 | Attending: Family | Primary: Family

## 2024-02-12 ENCOUNTER — Ambulatory Visit: Admit: 2024-02-12 | Discharge: 2024-02-12

## 2024-02-12 DIAGNOSIS — Z79899 Other long term (current) drug therapy: Principal | ICD-10-CM

## 2024-02-12 DIAGNOSIS — B20 Human immunodeficiency virus [HIV] disease: Principal | ICD-10-CM

## 2024-02-12 DIAGNOSIS — Z23 Encounter for immunization: Principal | ICD-10-CM

## 2024-02-12 DIAGNOSIS — Z9189 Other specified personal risk factors, not elsewhere classified: Principal | ICD-10-CM

## 2024-02-12 DIAGNOSIS — K6289 Other specified diseases of anus and rectum: Principal | ICD-10-CM

## 2024-02-12 DIAGNOSIS — Z5181 Encounter for therapeutic drug level monitoring: Principal | ICD-10-CM

## 2024-02-12 DIAGNOSIS — Z0184 Encounter for antibody response examination: Principal | ICD-10-CM

## 2024-02-12 DIAGNOSIS — Z113 Encounter for screening for infections with a predominantly sexual mode of transmission: Principal | ICD-10-CM

## 2024-02-12 DIAGNOSIS — Z202 Contact with and (suspected) exposure to infections with a predominantly sexual mode of transmission: Principal | ICD-10-CM

## 2024-02-12 LAB — CBC W/ AUTO DIFF
BASOPHILS ABSOLUTE COUNT: 0 10*9/L (ref 0.0–0.1)
BASOPHILS RELATIVE PERCENT: 0.7 %
EOSINOPHILS ABSOLUTE COUNT: 0.1 10*9/L (ref 0.0–0.5)
EOSINOPHILS RELATIVE PERCENT: 1.7 %
HEMATOCRIT: 41.3 % (ref 39.0–48.0)
HEMOGLOBIN: 13.8 g/dL (ref 12.9–16.5)
LYMPHOCYTES ABSOLUTE COUNT: 1.9 10*9/L (ref 1.1–3.6)
LYMPHOCYTES RELATIVE PERCENT: 44.8 %
MEAN CORPUSCULAR HEMOGLOBIN CONC: 33.3 g/dL (ref 32.0–36.0)
MEAN CORPUSCULAR HEMOGLOBIN: 29.1 pg (ref 25.9–32.4)
MEAN CORPUSCULAR VOLUME: 87.4 fL (ref 77.6–95.7)
MEAN PLATELET VOLUME: 8.1 fL (ref 6.8–10.7)
MONOCYTES ABSOLUTE COUNT: 0.4 10*9/L (ref 0.3–0.8)
MONOCYTES RELATIVE PERCENT: 8.5 %
NEUTROPHILS ABSOLUTE COUNT: 1.9 10*9/L (ref 1.8–7.8)
NEUTROPHILS RELATIVE PERCENT: 44.3 %
PLATELET COUNT: 178 10*9/L (ref 150–450)
RED BLOOD CELL COUNT: 4.73 10*12/L (ref 4.26–5.60)
RED CELL DISTRIBUTION WIDTH: 13.8 % (ref 12.2–15.2)
WBC ADJUSTED: 4.2 10*9/L (ref 3.6–11.2)

## 2024-02-12 LAB — LYMPH MARKER LIMITED,FLOW
ABSOLUTE CD3 CNT: 1520 {cells}/uL (ref 915–3400)
ABSOLUTE CD4 CNT: 760 {cells}/uL (ref 510–2320)
ABSOLUTE CD8 CNT: 741 {cells}/uL (ref 180–1520)
CD3% (T CELLS): 80 % (ref 61–86)
CD4% (T HELPER): 40 % (ref 34–58)
CD4:CD8 RATIO: 1 (ref 0.9–4.8)
CD8% T SUPPRESR: 39 % — ABNORMAL HIGH (ref 12–38)

## 2024-02-12 LAB — BASIC METABOLIC PANEL
ANION GAP: 9 mmol/L (ref 5–14)
BLOOD UREA NITROGEN: 9 mg/dL (ref 9–23)
BUN / CREAT RATIO: 10
CALCIUM: 9.6 mg/dL (ref 8.7–10.4)
CHLORIDE: 102 mmol/L (ref 98–107)
CO2: 28.7 mmol/L (ref 20.0–31.0)
CREATININE: 0.86 mg/dL (ref 0.73–1.18)
EGFR CKD-EPI (2021) MALE: >90 mL/min/1.73m2 (ref >=60)
GLUCOSE RANDOM: 80 mg/dL (ref 70–179)
POTASSIUM: 4.1 mmol/L (ref 3.4–4.8)
SODIUM: 140 mmol/L (ref 135–145)

## 2024-02-12 LAB — BILIRUBIN, TOTAL: BILIRUBIN TOTAL: 0.5 mg/dL (ref 0.3–1.2)

## 2024-02-12 LAB — AST: AST (SGOT): 14 U/L (ref <=34)

## 2024-02-12 LAB — ALT: ALT (SGPT): 11 U/L (ref 10–49)

## 2024-02-12 LAB — HIV RNA, QUANTITATIVE, PCR: HIV RNA QNT RSLT: NOT DETECTED

## 2024-02-12 MED ORDER — DOXYCYCLINE MONOHYDRATE 100 MG CAPSULE
ORAL_CAPSULE | Freq: Once | ORAL | 2 refills | 0 days | Status: CP | PRN
Start: 2024-02-12 — End: ?

## 2024-02-12 MED ADMIN — cabotegravir-rilpivirine (CABENUVA) 600 mg-900 mg/3 mL extended-release injection 6 mL: 6 mL | INTRAMUSCULAR | @ 20:00:00 | Stop: 2024-02-12

## 2024-02-12 MED ADMIN — cefTRIAXone (ROCEPHIN) 500 mg, lidocaine (PF) (XYLOCAINE-MPF) 10 mg/mL (1 %) 1.4286 mL injection: 500 mg | INTRAMUSCULAR | @ 20:00:00 | Stop: 2024-02-12

## 2024-02-12 NOTE — Unmapped (Signed)
 COVID Education:  Make sure you perform good hand washing (lasting 20 seconds), continue to social distance and limit close personal contact (which may include new sexual partners or having multiple partners during this period).  Try to isolate at home but please find ways to keep in touch with those close to you, such as meeting up with them electronically or socially distanced, and the ability to go outdoors alone or separated from others  If you become ill with fever, respiratory illness, sudden loss of taste and smell, stomach issues, diarrhea, nausea, vomiting - contact clinic for further instructions.  You should go to the emergency department if you develop systems such as shortness of breath, confusion, lightheadedness when standing, high fever.   Here is some information about HIV and CoVid vaccines: [email or web address]  If you're interested in receiving the CoVid vaccine when you're eligible, here are some resources for you to check and make an appointment:  Your local health department   [email or web address] through [facility]  [email or web address]  [facility] (if you are an established patient with them)  [email or web address]    URGENT CARE  Please call ahead to speak with the nursing staff if you are in need of an urgent appointment.       MEDICATIONS  For refills please contact your pharmacy and ask them to electronically send or fax the request to the clinic.   Please bring all medications in original bottles to every appointment.    HMAP (formerly ADAP) or [employer] Eligibility (required even if you do not receive medication through [facility])  Please remember to renew your Juanell Fairly eligibility during renewal periods which occur twice a year: January-March and July-September.     The following are needed for each renewal:   - [facility] Identification (if you don't have one, then a bill with your name and address in [place])   - proof of income (award letter, W-2, or last three check stubs)   If you are unable to come in for renewal, let us know if we can mail, fax or e-mail paperwork to you.   HMAP Contact: [phone].     Lab info:  Your most recent CD4 T-cell counts and viral loads are below. Here are a few things to keep in mind when looking at your numbers:  Our goal is to get your virus to be undetectable and keep it undetectable. If the virus is undetectable you are much more likely to stay healthy.  We consider your viral load to be undetectable if it says <40 or if it says Not detected.  For most people, we're checking CD4 counts every other visit (once or twice a year, or sometimes even less).  It's normal for your CD4 count to be different from visit to visit.   You can help by taking your medications at about the same time, every single day. If you're having trouble with taking your medications, it's important to let us know.    Lab Results   Component Value Date    ACD4 720 03/05/2023    CD4 36 03/05/2023    HIVCP <40 (H) 04/25/2021    HIVRS Not Detected 08/23/2023        Please note that your laboratory and other results may be visible to you in real time, possibly before they reach your provider. Please allow 48 hours for clinical interpretation of these results. Importantly, even if a result is flagged as abnormal, it may not be one that impacts  your health.    It was nice to have a visit with you today!  Follow-up information:        Provider today:  Varney Daily, FNP-BC      ID CLINIC address:   [facility] at [facility]  [address]  [place], [place] [address]    Contact information:    The ID clinic phone number is [phone]   The ID clinic fax number is [phone]  For urgent issues on nights and weekends: Call the ID Physician on-call through the [facility] Operator at [phone].    Please sign up for My [facility] Chart - This is a great way to review your labs and track your appointments    Please try to arrive 30 minutes BEFORE your scheduled appointment time!  This will give you time to fill out any front desk paperwork needed for your visit, and allow you to be seen as close to your scheduled appointment time as possible.
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
## 2024-02-12 NOTE — Unmapped (Signed)
 Patient here for Cabenuva injections. Denies changes in mood or sleep. Tolerated well. Needle size: 23G 1 1/2 Next appointment made for 05/27.

## 2024-02-13 LAB — SYPHILIS SCREEN: SYPHILIS RPR SCREEN: NONREACTIVE

## 2024-02-13 LAB — RUBELLA ANTIBODY, IGG: RUBELLA IGG SCREEN: POSITIVE

## 2024-02-13 NOTE — Unmapped (Signed)
 Missed patient during in clinic appointment on 02/12/2024 to assess for RW.  Attempted to reach them by phone today, but no answer. Left a voice message.          Mickle Asper,  Benefits & Eligibility Coordinator  Time of Intervention: 2 minutes

## 2024-02-14 NOTE — Unmapped (Signed)
 PROMIS Tablet Screening  Completed Date: 02/12/2024     SW reviewed self-administered screening.    Patient had a PHQ-9 score of 0  indicating None-Minimal depression.   Pt denies SI.   Pt scored 2 on AUDIT/AUDIT-C indicating Not-at-risk alcohol consumption  Pt  denies substance use in past 3 months.  Pt denies concerns for IPV.    Pt seen by provider for regular ID visit.  Pt was not seen in person by Social Work during this visit.     Sofie Rower, LCSW-A  [facility] Social Work

## 2024-02-16 LAB — MUMPS ANTIBODY, IGG: MUMPS IGG ANTIBODY: POSITIVE

## 2024-02-16 LAB — RUBEOLA ANTIBODY IGG: RUBEOLA IGG ANTIBODY: POSITIVE

## 2024-03-13 DIAGNOSIS — B20 Human immunodeficiency virus [HIV] disease: Principal | ICD-10-CM

## 2024-03-27 NOTE — Unmapped (Signed)
 [facility] Specialty and Home Delivery Pharmacy Clinic Administered Medication Refill Coordination Note      NAME:Robert Moss DOB: [birth year]      Medication: Cabenuva     Day Supply: 56 days      SHIPPING      Next delivery from [facility] Delivery [facility] [phone]) to [facility] Infectious Disease Clinic at [facility] for Robert Moss is scheduled for 05/21.    Clinic contact: BJ Turner     Patient's next nurse visit for administration: 05/30.    We will follow up with clinic monthly for standard refill processing and delivery.      Robert Moss  [facility] and [facility]

## 2024-04-01 MED FILL — CABENUVA 600 MG/3 ML-900 MG/3 ML IM SUSPENSION, EXTENDED RELEASE: INTRAMUSCULAR | 34 days supply | Qty: 6 | Fill #6

## 2024-04-11 ENCOUNTER — Ambulatory Visit: Admit: 2024-04-11 | Discharge: 2024-04-12 | Payer: Medicaid (Managed Care)

## 2024-04-11 DIAGNOSIS — B20 Human immunodeficiency virus [HIV] disease: Principal | ICD-10-CM

## 2024-04-11 MED ADMIN — cabotegravir-rilpivirine (CABENUVA) 600 mg-900 mg/3 mL extended-release injection 6 mL: 6 mL | INTRAMUSCULAR | @ 19:00:00 | Stop: 2024-04-11

## 2024-04-11 NOTE — Unmapped (Signed)
 Patient here for Cabenuva  injections. Denies changes in mood or sleep. Tolerated well. Needle size: 23G 1 1/2 Next appointment made for 7/25.

## 2024-05-09 DIAGNOSIS — B20 Human immunodeficiency virus [HIV] disease: Principal | ICD-10-CM

## 2024-05-09 MED ORDER — CABENUVA 600 MG/3 ML-900 MG/3 ML IM SUSPENSION, EXTENDED RELEASE
INTRAMUSCULAR | 3 refills | 112.00000 days
Start: 2024-05-09 — End: ?

## 2024-05-12 DIAGNOSIS — B20 Human immunodeficiency virus [HIV] disease: Principal | ICD-10-CM

## 2024-05-12 MED ORDER — CABOTEGRAVIR ER 600 MG/3 ML-RILPIVIRINE ER 900 MG/3ML IM SUSPENSION,ER
INTRAMUSCULAR | 6 refills | 56.00000 days | Status: CP
Start: 2024-05-12 — End: ?
  Filled 2024-05-27: qty 6, 34d supply, fill #0

## 2024-05-14 NOTE — Unmapped (Signed)
 Medical Case Management Attempt Social Work Note     Duration of Intervention: 5 minutes    REASON/TYPE OF CONTACT: Text    INTERVENTION:  SW attempted to contact for services but no answer.     PLAN:  SW will try to contact again at a later date.    Tyrihanna Wingert, LCSWA  [facility] Social Work

## 2024-05-20 NOTE — Unmapped (Addendum)
 [facility] Specialty and Home Delivery Pharmacy Clinic Administered Medication Refill Coordination Note      NAME:Robert Moss DOB: 04-08-2001      Medication: Cabenuva     Day Supply: 56 days      SHIPPING      Next delivery from [facility] Specialty and Home Delivery Pharmacy [phone]) to [facility] Infect[facility] Disease Clinic at [facility] for Robert Moss is scheduled for 07/16    Clinic contact: BJ Turner     Patient's next nurse visit for administration: 07/25    We will follow up with clinic monthly for standard refill processing and delivery.      Robert Moss  [facility] and [facility]

## 2024-05-20 NOTE — Unmapped (Signed)
 Opened in error

## 2024-05-20 NOTE — Unmapped (Deleted)
 [facility] Specialty and Home Delivery Pharmacy Clinic Administered Medication Refill Coordination Note      NAME:[name] DOB: February 21, 2001      Medication: Cabenuva     Day Supply: 56 days      SHIPPING      Next delivery from [facility] Specialty and Home Delivery Pharmacy [phone]) to [facility] Infectious Disease Clinic at [facility] for Weiland Vere Diantonio is scheduled for 07/17    Clinic contact: BJ Turner     Patient's next nurse visit for administration: 07/25    We will follow up with clinic monthly for standard refill processing and delivery.      Tyrene Nader LITTIE Hope  [facility] Specialty and [facility]

## 2024-06-06 DIAGNOSIS — B20 Human immunodeficiency virus [HIV] disease: Principal | ICD-10-CM

## 2024-06-06 MED ADMIN — cabotegravir-rilpivirine (CABENUVA) 600 mg-900 mg/3 mL extended-release injection 6 mL: 6 mL | INTRAMUSCULAR | @ 20:00:00 | Stop: 2024-06-06

## 2024-06-06 NOTE — Unmapped (Signed)
 Patient here for Cabenuva  injections. Denies changes in mood or sleep. Tolerated well. Needle size: 23G 1 1/2 Next appointment made for 08/01/2024.

## 2024-07-02 DIAGNOSIS — B20 Human immunodeficiency virus [HIV] disease: Principal | ICD-10-CM

## 2024-07-04 NOTE — Unmapped (Signed)
 Medical Case Management Social Work Note    Duration of Intervention: 15 minutes    TYPE OF CONTACT: Text    ASSESSMENT: Patient was due for a SW check in. Patient stated he was doing well. 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     INTERVENTION:  SW provided active listening, validation, empathic feedback, unconditional positive regard, and congruence within the relationship.    SW attempted to further assess, however, patient did not respond to outreach.     PLAN:  SW will follow up with patient at a later date.    Maigen Mozingo, LCSWA  [place] [facility]

## 2024-07-11 NOTE — Unmapped (Signed)
 [facility] Administered Medication Refill Coordination Note      NAME:Robert Moss DOB: [birth year]      Medication: Cabenuva     Day Supply: 56 days      SHIPPING      Next delivery from [facility] [phone]) to [facility] at [facility] for Robert Moss is scheduled for 09/11    Clinic contact: BJ Turner     Patient's next nurse visit for administration: 09/19    We will follow up with clinic monthly for standard refill processing and delivery.      Robert Moss Robert Moss  [facility]

## 2024-07-20 DIAGNOSIS — B20 Human immunodeficiency virus [HIV] disease: Principal | ICD-10-CM

## 2024-07-23 MED FILL — CABENUVA 600 MG/3 ML-900 MG/3 ML IM SUSPENSION, EXTENDED RELEASE: INTRAMUSCULAR | 34 days supply | Qty: 6 | Fill #1

## 2024-08-01 ENCOUNTER — Encounter: Admit: 2024-08-01 | Discharge: 2024-08-01 | Payer: Medicaid (Managed Care) | Attending: Family | Primary: Family

## 2024-08-01 DIAGNOSIS — Z1321 Encounter for screening for nutritional disorder: Principal | ICD-10-CM

## 2024-08-01 DIAGNOSIS — Z5181 Encounter for therapeutic drug level monitoring: Principal | ICD-10-CM

## 2024-08-01 DIAGNOSIS — Z9189 Other specified personal risk factors, not elsewhere classified: Principal | ICD-10-CM

## 2024-08-01 DIAGNOSIS — Z8619 Personal history of other infectious and parasitic diseases: Principal | ICD-10-CM

## 2024-08-01 DIAGNOSIS — B20 Human immunodeficiency virus [HIV] disease: Principal | ICD-10-CM

## 2024-08-01 DIAGNOSIS — Z202 Contact with and (suspected) exposure to infections with a predominantly sexual mode of transmission: Principal | ICD-10-CM

## 2024-08-01 DIAGNOSIS — Z113 Encounter for screening for infections with a predominantly sexual mode of transmission: Principal | ICD-10-CM

## 2024-08-01 DIAGNOSIS — Z79899 Other long term (current) drug therapy: Principal | ICD-10-CM

## 2024-08-01 LAB — BASIC METABOLIC PANEL
ANION GAP: 12 mmol/L (ref 5–14)
BLOOD UREA NITROGEN: 14 mg/dL (ref 9–23)
BUN / CREAT RATIO: 16
CALCIUM: 9.2 mg/dL (ref 8.7–10.4)
CHLORIDE: 102 mmol/L (ref 98–107)
CO2: 24.2 mmol/L (ref 20.0–31.0)
CREATININE: 0.86 mg/dL (ref 0.73–1.18)
EGFR CKD-EPI (2021) MALE: >90 mL/min/1.73m2 (ref >=60)
GLUCOSE RANDOM: 80 mg/dL (ref 70–179)
POTASSIUM: 4 mmol/L (ref 3.4–4.8)
SODIUM: 138 mmol/L (ref 135–145)

## 2024-08-01 LAB — CBC W/ AUTO DIFF
BASOPHILS ABSOLUTE COUNT: 0 10*9/L (ref 0.0–0.1)
BASOPHILS RELATIVE PERCENT: 0.3 %
EOSINOPHILS ABSOLUTE COUNT: 0.1 10*9/L (ref 0.0–0.5)
EOSINOPHILS RELATIVE PERCENT: 0.9 %
HEMATOCRIT: 41 % (ref 39.0–48.0)
HEMOGLOBIN: 13.2 g/dL (ref 12.9–16.5)
LYMPHOCYTES ABSOLUTE COUNT: 2.2 10*9/L (ref 1.1–3.6)
LYMPHOCYTES RELATIVE PERCENT: 41.4 %
MEAN CORPUSCULAR HEMOGLOBIN CONC: 32.2 g/dL (ref 32.0–36.0)
MEAN CORPUSCULAR HEMOGLOBIN: 27.6 pg (ref 25.9–32.4)
MEAN CORPUSCULAR VOLUME: 85.9 fL (ref 77.6–95.7)
MEAN PLATELET VOLUME: 8.5 fL (ref 6.8–10.7)
MONOCYTES ABSOLUTE COUNT: 0.4 10*9/L (ref 0.3–0.8)
MONOCYTES RELATIVE PERCENT: 7.4 %
NEUTROPHILS ABSOLUTE COUNT: 2.7 10*9/L (ref 1.8–7.8)
NEUTROPHILS RELATIVE PERCENT: 50 %
PLATELET COUNT: 182 10*9/L (ref 150–450)
RED BLOOD CELL COUNT: 4.78 10*12/L (ref 4.26–5.60)
RED CELL DISTRIBUTION WIDTH: 13.6 % (ref 12.2–15.2)
WBC ADJUSTED: 5.3 10*9/L (ref 3.6–11.2)

## 2024-08-01 LAB — BILIRUBIN, TOTAL: BILIRUBIN TOTAL: 0.4 mg/dL (ref 0.3–1.2)

## 2024-08-01 LAB — ALT: ALT (SGPT): 21 U/L (ref 10–49)

## 2024-08-01 LAB — AST: AST (SGOT): 24 U/L (ref <=34)

## 2024-08-01 LAB — HIV RNA, QUANTITATIVE, PCR: HIV RNA QNT RSLT: NOT DETECTED

## 2024-08-01 MED ORDER — DOXYCYCLINE MONOHYDRATE 100 MG CAPSULE
ORAL_CAPSULE | Freq: Once | ORAL | 2 refills | 0.00000 days | Status: CP | PRN
Start: 2024-08-01 — End: ?

## 2024-08-01 MED ADMIN — cabotegravir-rilpivirine (CABENUVA) 600 mg-900 mg/3 mL extended-release injection 6 mL: 6 mL | INTRAMUSCULAR | @ 19:00:00 | Stop: 2024-08-01

## 2024-08-01 NOTE — Unmapped (Signed)
 COVID Education:  Make sure you perform good hand washing (lasting 20 seconds), continue to social distance and limit close personal contact (which may include new sexual partners or having multiple partners during this period).  Try to isolate at home but please find ways to keep in touch with those close to you, such as meeting up with them electronically or socially distanced, and the ability to go outdoors alone or separated from others  If you become ill with fever, respiratory illness, sudden loss of taste and smell, stomach issues, diarrhea, nausea, vomiting - contact clinic for further instructions.  You should go to the emergency department if you develop systems such as shortness of breath, confusion, lightheadedness when standing, high fever.   Here is some information about HIV and CoVid vaccines: [email or web address]  If you're interested in receiving the CoVid vaccine when you're eligible, here are some resources for you to check and make an appointment:  Your local health department   [email or web address] through [facility]  [email or web address]  [facility] (if you are an established patient with them)  [email or web address]    URGENT CARE  Please call ahead to speak with the nursing staff if you are in need of an urgent appointment.       MEDICATIONS  For refills please contact your pharmacy and ask them to electronically send or fax the request to the clinic.   Please bring all medications in original bottles to every appointment.    [facility] (formerly [facility]) or [employer] Eligibility (required even if you do not receive medication through [facility])  Please remember to renew your [employer] eligibility during renewal periods which occur twice a year: January-March and July-September.     The following are needed for each renewal:   - [place]  Identification (if you don't have one, then a bill with your name and address in [place] )   - proof of income (award letter, W-2, or last three check stubs)   If you are unable to come in for renewal, let us  know if we can mail, fax or e-mail paperwork to you.   [facility]MAP Contact: [phone].     Lab info:  Your most recent CD4 T-cell counts and viral loads are below. Here are a few things to keep in mind when looking at your numbers:  Our goal is to get your virus to be undetectable and keep it undetectable. If the virus is undetectable you are much more likely to stay healthy.  We consider your viral load to be undetectable if it says <40 or if it says Not detected.  For most people, we're checking CD4 counts every other visit (once or twice a year, or sometimes even less).  It's normal for your CD4 count to be different from visit to visit.   You can help by taking your medications at about the same time, every single day. If you're having trouble with taking your medications, it's important to let us  know.    Lab Results   Component Value Date    ACD4 760 02/12/2024    CD4 40 02/12/2024    HIVCP <40 (H) 04/25/2021    HIVRS Not Detected 02/12/2024        Please note that your laboratory and other results may be visible to you in real time, possibly before they reach your provider. Please allow 48 hours for clinical interpretation of these results. Importantly, even if a result is flagged as abnormal, it may not be one that impacts  your health.    It was nice to have a visit with you today!  Follow-up information:        Provider today:  Mical Kicklighter, FNP-BC      ID CLINIC address:   [facility] at [facility]  [address]  [place], [place] [address]    Contact information:    The ID clinic phone number is [phone]   The ID clinic fax number is [phone]  For urgent issues on nights and weekends: Call the ID Physician on-call through the [facility] Operator at [phone].    Please sign up for My [place] Chart - This is a great way to review your labs and track your appointments    Please try to arrive 30 minutes BEFORE your scheduled appointment time!  This will give you time to fill out any front desk paperwork needed for your visit, and allow you to be seen as close to your scheduled appointment time as possible.

## 2024-08-01 NOTE — Unmapped (Signed)
 [facility] Annual Assessment & Careplan Social Work Note     Duration of Intervention: 25 minutes    TYPE OF CONTACT: Face to Face - In Person      MINI COMPREHENSIVE ASSESSMENT    ** MEDICAL NEEDS **  1. HIV Medical Needs: No  2. HIV Medication Adherence: No Patient is attending his scheduled Cabenuva  visits.   3. HIV Medical Care (engagement in care): No  4. Non-HIV Medical Needs: No    ** MH/SA NEEDS **  5. Mental Health: No Patient reports that he is overall doing well. 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 He stated that he is currently in a stable and positive mental space. While satisfied with his current role, he expressed a desire to continue advancing in his career and is exploring potential opportunities for professional growth. There are no current concerns reported at this time.    6. Drug/Alcohol Use: Patient reported that no alcohol or tobacco use. Patient reported he smokes hookah occasionally.     ** FISCAL NEEDS **  7. Financial Situation: No  8. Benefits/Insurance/Pharmacy Assistance: No    ** BASIC NEEDS **  9. Housing: Stable/Permanent  10. Transportation: No  11. Social Support System: No Patient reported his mom and partner is his current support system.   12. Food/Nutrition, Clothing, Utilities: No  Social Technical sales engineer Insecurity Screening:     I'm going to read you two statements that people have made about their food situation. For each statement, please tell me whether the statement was often true, sometimes true, or never true for your household in the last month.      1. ???We worried whether our food would run out before we got money to buy more.???   No     2. ???The food that we bought just didn't last, and we didn't have money to get more.  No    ** PREVENTION NEEDS **  13. Risk Reduction Counseling Needs: No  14. Disclosure to Partners: No, Patient disclosed that he recently informed his new partner of his status and reported that the conversation went well. He expressed feeling relieved and supported in response to how the disclosure was received.    ** LEGAL NEEDS **  15. No    ** RESOURCE COORDINATION **  16. Navigating social/health care systems: No  17. Care coordination: No    **ACTIVITIES OF DAILY LIVING  18. ADL assistance: No      Enrolled in Medical Case Management:  yes       INTERVENTION:  SW provided active listening, validation, empathic feedback, unconditional positive regard, and congruence within the relationship.       Medical Case Management Care Plan     ID CARE PLAN - HEALTH MAINTENANCE     Need: Continued supportive health maintenance      Short Term Goal/Objective:   1. Continue to support patient's medication adherence   2. Continue to support patient's medical appointment adherence   3. Develop plan to address barriers as they arise by contacting social worker  4. Continue to engage with financial counselors to assist in ensuring eligibility of Robert Moss and [employer].      Person Responsible for Action: patient, Child psychotherapist, provider, financial counselors     Intervention/Activities/Actions:   1. Provide appointment reminders one week and one day prior to his  appointment.   2. Provide follow up information regarding patient's lab results one week post appointment.   3. Provide support regarding medication adherence, risk reduction, or other counseling as needed.   4. Reinforcement of information as needed during clinic visits or by phone through calls and text messages.      Outcome:  1. Patient asks questions/seeks information to reinforce or expand understanding of HIV.   2. Document evidence in EPIC that patient has either attended next scheduled ID Clinic appointment or has called to reschedule  3. Patient takes prescribed medication on a daily basis as reported to SW or patient's medical provider.     This patient is being provided a service through [employer] of resort. A benefits investigation was done by social work staff and the patient was found to have no insurance or that their health insurance does not cover the services necessary for patient success. In some cases, if health insurance does cover the service, social work staff serve as interim providers until a long term service provider can be found in cases where current service providers are not accessible due to affordability, availability, or specialist knowledge.      Shaunda Tipping, LCSWA  [place] [facility]

## 2024-08-01 NOTE — Unmapped (Signed)
 Duration of Intervention: 60 minutes   Type of Contact: in-person   Reason for Contact: Patient in clinic for scheduled Cabenuva  injection for ongoing HIV-1 maintenance therapy.   Intervention: Patient presented for routine Cabenuva  injection. Discussed any changes since the last visit:   Mood:  No changes  Sleep: No Changes   Cabenuva  administered per protocol:   Medication: Cabotegravir  and Rilpivirine  Bi-Monthly   Needle size: 23G 1 1/2  Patient tolerated the injection well   Observed post-injection; no immediate adverse effects noted   Care Plan:   Continue Cabenuva  per established dosing interval   Next injection scheduled for: 09/26/2024    Patient will not meet with the provider at the next visit   Monitor for side effects and assess mood/sleep changes at follow-ups   Reinforce treatment adherence to ??7-day dosing window      Electronically signed by:   Clydie Bihari, RN
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
## 2024-08-04 LAB — RPR, THERAPY MONITORING: SYPHILIS RPR SCREEN: NONREACTIVE

## 2024-08-05 NOTE — Unmapped (Signed)
 PROMIS Tablet Screening  Completed Date: 08/01/2024     SW reviewed self-administered screening.    Patient had a PHQ-9 score of  0  indicating  no depression.   Pt denies SI.   Pt scored 1 on AUDIT/AUDIT-C indicating Not-at-risk alcohol consumption  Pt  denies substance use in past 3 months.  Pt denies concerns for IPV.    Pt seen by provider for regular ID visit.  Pt was seen in person by Social Work during this visit.           Over the last 2 weeks, how often have you been bothered by any of the following problems? Not at all Several  days More  than half  the day Nearly  every  day   1. Marvie Calender interest or pleasure in doing things 0 1 2 3    2. Feeling down, depressed, or hopeless 0 1 2 3    3. Trouble falling or staying asleep, or sleeping too much 0 1 2 3    4. Feeling tired or having Joleene Burnham energy 0 1 2 3    5. Poor appetite or overeating 0 1 2 3    6. Feeling bad about yourself -- or that you are a failure or  have let yourself or your family down 0 1 2 3    7. Trouble concentrating on things, such as reading the  newspaper or watching television 0 1 2 3    8. Moving or speaking so slowly that other people could have  noticed? Or the opposite -- being so fidgety or restless  that you have been moving around a lot more than usual 0 1 2 3    9. Thoughts that you would be better off dead or of hurting  yourself in some way 0 1 2 3    Total: 0     Joleene Burnham, LCSWA  [facility]

## 2024-08-06 LAB — VITAMIN D 25 HYDROXY: VITAMIN D, TOTAL (25OH): 15.5 ng/mL — ABNORMAL LOW (ref 20.0–80.0)

## 2024-08-13 NOTE — Unmapped (Signed)
 Assessment and Plan     Myopia, bilateral  Updated spec Rx - ed re importance of glasses as a back up to CL wear.   CL trials dispensed and Rx finalized - spherical, ed to send mychart message if he prefers previous astigmatic lenses.   CL trials dispensed and Rx finalized.   Ed re proper CL hygiene (not to sleep/shower/swim in lenses), remove lenses nightly and clean with multipurpose solution. Remove lenses and RTC immediately if pain/redness/change in vision.     2.  HIV disease    (CMS-HCC)  Viral load undetectable.   Ocular health unremarkable with dilated exam.   Monitor annually.     Return in about 1 year (around 08/13/2025).

## 2024-08-17 DIAGNOSIS — B20 Human immunodeficiency virus [HIV] disease: Principal | ICD-10-CM

## 2024-09-14 DIAGNOSIS — B20 Human immunodeficiency virus [HIV] disease: Principal | ICD-10-CM

## 2024-09-15 NOTE — Progress Notes (Signed)
 [facility] Clinic Administered Medication Refill Coordination Note      NAME:Robert Moss DOB: 10-08-2001      Medication: CABENUVA  600 mg/3 mL- 900 mg/3 mL extended-release injection (cabotegravir -rilpivirine )    Day Supply: 56 days      SHIPPING      Next delivery from [facility] [phone]) to [facility] for Robert Moss is scheduled for 09/23/2024.    Clinic contact: BJ Turner    Patient's next nurse visit for administration: 09/26/2024.    We will follow up with clinic monthly for standard refill processing and delivery.      Robert Moss  [facility] Specialty Technician

## 2024-09-19 NOTE — Telephone Encounter (Signed)
 Robert Moss called and asked to change his appt time to around 4pm for the day of his Cabenuva . Let him know we would accommodate him.

## 2024-09-22 MED FILL — CABENUVA 600 MG/3 ML-900 MG/3 ML IM SUSPENSION, EXTENDED RELEASE: INTRAMUSCULAR | 34 days supply | Qty: 6 | Fill #2

## 2024-09-26 DIAGNOSIS — B20 Human immunodeficiency virus [HIV] disease: Principal | ICD-10-CM

## 2024-09-26 MED ADMIN — cabotegravir-rilpivirine (CABENUVA) 600 mg-900 mg/3 mL extended-release injection 6 mL: 6 mL | INTRAMUSCULAR | @ 19:00:00 | Stop: 2024-09-26

## 2024-09-26 NOTE — Progress Notes (Signed)
 Duration of Intervention: 60 minutes   Type of Contact: in-person   Reason for Contact: Patient in clinic for scheduled Cabenuva  injection for ongoing HIV-1 maintenance therapy.   Intervention: Patient presented for routine Cabenuva  injection. Discussed any changes since the last visit:   Mood:  No changes  Sleep: No Changes   Cabenuva  administered per protocol:   Medication: Cabotegravir  and Rilpivirine  Bi-Monthly   Needle size: 22G 2  Patient tolerated the injection well   Observed post-injection; no immediate adverse effects noted   Care Plan:   Continue Cabenuva  per established dosing interval   Next injection scheduled for: 11/21/2024.   Patient will not meet with the provider at the next visit   Monitor for side effects and assess mood/sleep changes at follow-ups   Reinforce treatment adherence to ??7-day dosing window      Electronically signed by:   Isaiah KANDICE Sharps, RN

## 2024-11-04 NOTE — Progress Notes (Signed)
 Medical Case Management Attempt Social Work Note     Duration of Intervention: 5 minutes    REASON/TYPE OF CONTACT: Text    INTERVENTION:  SW attempted to contact for services but no answer.     PLAN:  SW will try to contact again at a later date.    Tyrihanna Wingert, LCSWA  [facility]

## 2024-11-19 MED FILL — CABENUVA 600 MG/3 ML-900 MG/3 ML IM SUSPENSION, EXTENDED RELEASE: INTRAMUSCULAR | 34 days supply | Qty: 6 | Fill #3

## 2024-11-21 DIAGNOSIS — B20 Human immunodeficiency virus [HIV] disease: Principal | ICD-10-CM

## 2024-11-21 MED ADMIN — cabotegravir-rilpivirine (CABENUVA) 600 mg-900 mg/3 mL extended-release injection 6 mL: 6 mL | INTRAMUSCULAR | @ 20:00:00 | Stop: 2024-11-21

## 2024-11-21 NOTE — Progress Notes (Signed)
 Duration of Intervention: 60 minutes   Type of Contact: in-person   Reason for Contact: Patient in clinic for scheduled Cabenuva  injection for ongoing HIV-1 maintenance therapy.   Intervention: Patient presented for routine Cabenuva  injection. Discussed any changes since the last visit:   Mood:  No changes  Sleep: No Changes   Cabenuva  administered per protocol:   Medication: Cabotegravir  and Rilpivirine  Bi-Monthly   Needle size: 23G 1 1/2  Patient tolerated the injection well   Observed post-injection; no immediate adverse effects noted   Care Plan:   Continue Cabenuva  per established dosing interval   Next injection scheduled for: 03/06   Patient will meet with the provider at the next visit   Monitor for side effects and assess mood/sleep changes at follow-ups   Reinforce treatment adherence to ??7-day dosing window      Electronically signed by:   Ailine Hefferan D Chaselyn Nanney, RN

## 2024-12-07 DIAGNOSIS — B20 Human immunodeficiency virus [HIV] disease: Principal | ICD-10-CM
